# Patient Record
Sex: Female | Born: 1950 | Race: White | Hispanic: No | Smoking: Current every day smoker
Health system: Southern US, Community
[De-identification: ages and names within clinical notes are randomized; demographics above are authoritative.]

## PROBLEM LIST (undated history)

## (undated) DIAGNOSIS — E785 Hyperlipidemia, unspecified: Secondary | ICD-10-CM

## (undated) DIAGNOSIS — Z923 Personal history of irradiation: Secondary | ICD-10-CM

## (undated) DIAGNOSIS — E079 Disorder of thyroid, unspecified: Secondary | ICD-10-CM

## (undated) DIAGNOSIS — T7840XA Allergy, unspecified, initial encounter: Secondary | ICD-10-CM

## (undated) DIAGNOSIS — T300 Burn of unspecified body region, unspecified degree: Secondary | ICD-10-CM

## (undated) DIAGNOSIS — H269 Unspecified cataract: Secondary | ICD-10-CM

## (undated) DIAGNOSIS — Z5189 Encounter for other specified aftercare: Secondary | ICD-10-CM

## (undated) DIAGNOSIS — F329 Major depressive disorder, single episode, unspecified: Secondary | ICD-10-CM

## (undated) DIAGNOSIS — F32A Depression, unspecified: Secondary | ICD-10-CM

## (undated) DIAGNOSIS — F419 Anxiety disorder, unspecified: Secondary | ICD-10-CM

## (undated) HISTORY — DX: Disorder of thyroid, unspecified: E07.9

## (undated) HISTORY — DX: Allergy, unspecified, initial encounter: T78.40XA

## (undated) HISTORY — DX: Personal history of irradiation: Z92.3

## (undated) HISTORY — DX: Hyperlipidemia, unspecified: E78.5

## (undated) HISTORY — DX: Anxiety disorder, unspecified: F41.9

## (undated) HISTORY — PX: TONSILLECTOMY: SHX5217

## (undated) HISTORY — PX: WISDOM TOOTH EXTRACTION: SHX21

## (undated) HISTORY — DX: Burn of unspecified body region, unspecified degree: T30.0

## (undated) HISTORY — DX: Depression, unspecified: F32.A

## (undated) HISTORY — DX: Unspecified cataract: H26.9

## (undated) HISTORY — DX: Encounter for other specified aftercare: Z51.89

## (undated) HISTORY — PX: DILATION AND CURETTAGE OF UTERUS: SHX78

## (undated) HISTORY — PX: SKIN GRAFT: SHX250

## (undated) HISTORY — PX: ORIF CLAVICLE FRACTURE: SUR924

## (undated) HISTORY — DX: Major depressive disorder, single episode, unspecified: F32.9

---

## 2012-03-16 ENCOUNTER — Ambulatory Visit: Payer: Self-pay | Admitting: Family Medicine

## 2012-03-16 ENCOUNTER — Telehealth: Payer: Self-pay | Admitting: Family Medicine

## 2012-03-16 VITALS — BP 123/77 | HR 78 | Temp 98.1°F | Resp 16 | Ht 65.5 in | Wt 153.0 lb

## 2012-03-16 DIAGNOSIS — F329 Major depressive disorder, single episode, unspecified: Secondary | ICD-10-CM

## 2012-03-16 DIAGNOSIS — F32A Depression, unspecified: Secondary | ICD-10-CM

## 2012-03-16 DIAGNOSIS — G4700 Insomnia, unspecified: Secondary | ICD-10-CM

## 2012-03-16 DIAGNOSIS — J309 Allergic rhinitis, unspecified: Secondary | ICD-10-CM

## 2012-03-16 MED ORDER — FLUTICASONE PROPIONATE 50 MCG/ACT NA SUSP: 2.0000 | Freq: Every day | NASAL | Status: DC

## 2012-03-16 MED ORDER — ESCITALOPRAM OXALATE 10 MG PO TABS: 10.0000 mg | ORAL_TABLET | Freq: Every day | ORAL | Status: DC

## 2012-03-16 MED ORDER — ZOLPIDEM TARTRATE 10 MG PO TABS: ORAL_TABLET | ORAL | Status: DC
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
## 2012-03-16 NOTE — Telephone Encounter (Signed)
Called Ambien 10mg  ito [employer] at 2:20pm an left message on their machine

## 2012-03-26 ENCOUNTER — Telehealth: Payer: Self-pay

## 2012-03-26 DIAGNOSIS — F32A Depression, unspecified: Secondary | ICD-10-CM

## 2012-03-26 DIAGNOSIS — F329 Major depressive disorder, single episode, unspecified: Secondary | ICD-10-CM

## 2012-03-26 DIAGNOSIS — G4700 Insomnia, unspecified: Secondary | ICD-10-CM

## 2012-03-26 NOTE — Telephone Encounter (Signed)
Dr.Copland, Pt calling to inform you  that she is doing great. Also pt would like to have a 2 month supply of ambiene and a 3 month supply of lexapro called into her pharmacy. Pharmacy: [facility] [phone] ( pt would like to use this pharmacy because the copy is $5) Best# [phone]

## 2012-03-27 MED ORDER — ZOLPIDEM TARTRATE 10 MG PO TABS: ORAL_TABLET | ORAL | Status: DC

## 2012-03-27 MED ORDER — ESCITALOPRAM OXALATE 10 MG PO TABS: 20.0000 mg | ORAL_TABLET | Freq: Every day | ORAL | Status: DC

## 2012-03-27 NOTE — Telephone Encounter (Signed)
This is ok- she may have  lexapro 10mg  2po daily, #180 ambien 10mg  1 po qhs as needed for sleep #45 Called in to her pharmacy.  Thanks!

## 2012-03-27 NOTE — Telephone Encounter (Signed)
Called in both Rxs to [facility] and entered in EPIC as phone ins for record. LMOM for pt that her request was completed.

## 2012-03-27 NOTE — Telephone Encounter (Signed)
Jill Adams or Britta Mccreedy- whoever sees this first: This is fine- she may have  lexapro 10 mg 2 PO daily, #180 ambien 10mg  1PO qhs prn sleep, #45  Please call to her pharmacy

## 2012-04-01 ENCOUNTER — Telehealth: Payer: Self-pay

## 2012-04-01 NOTE — Telephone Encounter (Signed)
Note placed at front desk for her. I have left message for her to advise it is up front.

## 2012-04-01 NOTE — Telephone Encounter (Signed)
PT STATES SHE NEED AN OOW NOTE FOR THE 03/16/12 VISIT. DIDN'T REALIZE IT, BUT HER JOB IS REQUIRING IT. PLEASE CALL [phone] WHEN READY

## 2012-06-09 ENCOUNTER — Telehealth: Payer: Self-pay

## 2012-06-09 DIAGNOSIS — G4700 Insomnia, unspecified: Secondary | ICD-10-CM

## 2012-06-09 MED ORDER — ZOLPIDEM TARTRATE 10 MG PO TABS: ORAL_TABLET | ORAL | Status: DC

## 2012-06-09 NOTE — Telephone Encounter (Signed)
[facility]armacy [facility] is trying to get auth for rx refill on pt ambien, pharmacy states that cant escribe to [place] because we may not be linked up to do so. Please call in auth to pharmacy [phone] Marcelino Duster if know answer please leave a vm.

## 2012-06-09 NOTE — Telephone Encounter (Signed)
Please advise on renewal of Ambien. Fax request came in. [facility] states they can not send electronic.

## 2012-06-09 NOTE — Telephone Encounter (Signed)
Please call in zolpidem #30, take one tab [facility] for sleep prn

## 2012-06-10 ENCOUNTER — Telehealth: Payer: Self-pay | Admitting: Family Medicine

## 2012-06-10 NOTE — Telephone Encounter (Signed)
Spoke with pharmacist call in RX

## 2012-07-14 ENCOUNTER — Other Ambulatory Visit: Payer: Self-pay | Admitting: Physician Assistant

## 2012-07-14 MED ORDER — ESCITALOPRAM OXALATE 20 MG PO TABS: 20.0000 mg | ORAL_TABLET | Freq: Every day | ORAL | Status: DC

## 2012-07-22 ENCOUNTER — Telehealth: Payer: Self-pay

## 2012-07-22 DIAGNOSIS — G4700 Insomnia, unspecified: Secondary | ICD-10-CM

## 2012-07-22 MED ORDER — ZOLPIDEM TARTRATE 10 MG PO TABS: ORAL_TABLET | ORAL | Status: DC

## 2012-07-22 NOTE — Telephone Encounter (Signed)
PT HAS REQUESTED AMBIEN - DID NOT GET A RESPONSE -  [facility]  PHONE [phone]  FAX  [phone]  PT CAN BE REACHED ON CELL PHONE [phone] (H)

## 2012-07-22 NOTE — Telephone Encounter (Signed)
Called pharmacy and did refill.  Also asked pt to see me in the next couple of months as we last visited in July

## 2012-10-31 ENCOUNTER — Ambulatory Visit: Payer: Self-pay | Admitting: Emergency Medicine

## 2012-10-31 VITALS — BP 148/80 | HR 66 | Temp 97.6°F | Resp 16 | Ht 65.5 in | Wt 163.0 lb

## 2012-10-31 DIAGNOSIS — G4700 Insomnia, unspecified: Secondary | ICD-10-CM

## 2012-10-31 DIAGNOSIS — F329 Major depressive disorder, single episode, unspecified: Secondary | ICD-10-CM

## 2012-10-31 DIAGNOSIS — F411 Generalized anxiety disorder: Secondary | ICD-10-CM

## 2012-10-31 DIAGNOSIS — F32A Depression, unspecified: Secondary | ICD-10-CM

## 2012-10-31 MED ORDER — ESCITALOPRAM OXALATE 20 MG PO TABS: 20.0000 mg | ORAL_TABLET | Freq: Every day | ORAL | Status: DC

## 2012-10-31 MED ORDER — ZOLPIDEM TARTRATE 10 MG PO TABS: ORAL_TABLET | ORAL | Status: DC

## 2012-10-31 NOTE — Patient Instructions (Addendum)

## 2012-10-31 NOTE — Progress Notes (Signed)
[facility] [address] [place] [address] [phone]  Date:  10/31/2012   Name:  Jill Adams   DOB:  1951/02/01   MRN:  [id number]  PCP:  No primary provider on file.    Chief Complaint: Medication Refill   History of Present Illness:  Jill Adams is a 62 y.o. very pleasant female patient who presents with the following:  Has issues with stress and anxiety and treated by Dr Patsy Lager with Remus Loffler and lexapro successfully with resolution of her symptoms.  Now has run out of her medication and needs a refill.  She is tolerating the meds well without any adverse reaction, including somnambulism and other nocturnal misadventures related to [place].  There is no problem list on file for this patient.   Past Medical History  Diagnosis Date  . Depression   . Cataract   . Anxiety     History reviewed. No pertinent past surgical history.  History  Substance Use Topics  . Smoking status: Current Every Day Smoker  . Smokeless tobacco: Not on file  . Alcohol Use: Yes    History reviewed. No pertinent family history.  No Known Allergies  Medication list has been reviewed and updated.  Current Outpatient Prescriptions on File Prior to Visit  Medication Sig Dispense Refill  . escitalopram (LEXAPRO) 20 MG tablet Take 1 tablet (20 mg total) by mouth daily.  30 tablet  2  . zolpidem (AMBIEN) 10 MG tablet Take one half or one tablet at bedtime as needed.  30 tablet  1  . fluticasone (FLONASE) 50 MCG/ACT nasal spray Place 2 sprays into the nose daily.  16 g  6   No current facility-administered medications on file prior to visit.    Review of Systems:  As per HPI, otherwise negative.    Physical Examination: Filed Vitals:   10/31/12 1127  BP: 148/80  Pulse: 66  Temp: 97.6 F (36.4 C)  Resp: 16   Filed Vitals:   10/31/12 1127  Height: 5' 5.5" (1.664 m)  Weight: 163 lb (73.936 kg)   Body mass index is 26.7 kg/(m^2). Ideal Body  Weight: Weight in (lb) to have BMI = 25: 152.2  GEN: WDWN, NAD, Non-toxic, A & O x 3 HEENT: Atraumatic, Normocephalic. Neck supple. No masses, No LAD. Ears and Nose: No external deformity. CV: RRR, No M/G/R. No JVD. No thrill. No extra heart sounds. PULM: CTA B, no wheezes, crackles, rhonchi. No retractions. No resp. distress. No accessory muscle use. ABD: S, NT, ND, +BS. No rebound. No HSM. EXTR: No c/c/e NEURO Normal gait.  PSYCH: Normally interactive. Conversant. Not depressed or anxious appearing.  Calm demeanor.    Assessment and Plan: Anxiety Refill meds Follow up with Dr Gwynne Edinger, Tessa Lerner, MD

## 2013-01-18 ENCOUNTER — Ambulatory Visit: Payer: Self-pay | Admitting: Physician Assistant

## 2013-01-18 VITALS — BP 130/78 | HR 79 | Temp 98.0°F | Resp 16 | Ht 65.5 in | Wt 162.0 lb

## 2013-01-18 DIAGNOSIS — Z111 Encounter for screening for respiratory tuberculosis: Secondary | ICD-10-CM

## 2013-01-18 NOTE — Progress Notes (Signed)
  Subjective:    Patient ID: Jill Adams, female    DOB: 1951-05-13, 62 y.o.   MRN: [id number]  HPI   Jill Adams is a very pleasant 62 yr old female here for TB skin test.  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  Has had PPD in the past, thinks last about 1 yr ago. Never positive.  Asymptomatic.  See questionnaire below.    Review of Systems  All other systems reviewed and are negative.       Objective:   Physical Exam  Vitals reviewed. Constitutional: She is oriented to person, place, and time. She appears well-developed and well-nourished. No distress.  HENT:  Head: Normocephalic and atraumatic.  Eyes: Conjunctivae are normal. No scleral icterus.  Cardiovascular: Normal rate, regular rhythm and normal heart sounds.   Pulmonary/Chest: Effort normal and breath sounds normal. She has no wheezes. She has no rales.  Neurological: She is alert and oriented to person, place, and time.  Skin: Skin is warm and dry.  Psychiatric: She has a normal mood and affect. Her behavior is normal.          Assessment & Plan:  Screening for tuberculosis   Jill Adams is a 62 yr old female here for TB skin test.  PPD placed.  Pt will return in 48-72 hours for reading.

## 2013-01-18 NOTE — Progress Notes (Signed)
  Tuberculosis Risk Questionnaire  1. Were you born outside the [place] in one of the following parts of the world: [place], [place], [place], [place] or [place]?  No  2. Have you traveled outside the [place] and lived for more than one month in one of the following parts of the world: [place], [place], [place], [place] or [place]?  No  3. Do you have a compromised immune system such as from any of the following conditions:HIV/AIDS, organ or bone marrow transplantation, diabetes, immunosuppressive medicines (e.g. Prednisone, Remicaide), leukemia, lymphoma, cancer of the head or neck, gastrectomy or jejunal bypass, end-stage renal disease (on dialysis), or silicosis?  No   4. Have you ever or do you plan on working in: a residential care center, a health care facility, a jail or prison or homeless shelter?  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 Have you ever: injected illegal drugs, used crack cocaine, lived in a homeless shelter  or been in jail or prison?   No  6. Have you ever been exposed to anyone with infectious tuberculosis?  No   Tuberculosis Symptom Questionnaire  Do you currently have any of the following symptoms?  1. Unexplained cough lasting more than 3 weeks? No  Unexplained fever lasting more than 3 weeks. No   3. Night Sweats (sweating that leaves the bedclothes and sheets wet)   No  4. Shortness of Breath No  5. Chest Pain No  6. Unintentional weight loss  No  7. Unexplained fatigue (very tired for no reason) No

## 2013-01-18 NOTE — Patient Instructions (Addendum)
Return in 48-72 hours to have your TB skin test read   Tuberculin Skin Test The PPD skin test is a method used to help with the diagnosis of a disease called tuberculosis (TB). HOW THE TEST IS DONE  The test site (usually the forearm) is cleansed. The PPD extract is then injected under the top layer of skin, causing a blister to form on the skin. The reaction will take 48 - 72 hours to develop. You must return to your health care provider within that time to have the area checked. This will determine whether you have had a significant reaction to the PPD test. A reaction is measured in millimeters of hard swelling (induration) at the site. PREPARATION FOR TEST  There is no special preparation for this test. People with a skin rash or other skin irritations on their arms may need to have the test performed at a different spot on the body. Tell your health care provider if you have ever had a positive PPD skin test. If so, you should not have a repeat PPD test. Tell your doctor if you have a medical condition or if you take certain drugs, such as steroids, that can affect your immune system. These situations may lead to inaccurate test results. NORMAL FINDINGS A negative reaction (no induration) or a level of hard swelling that falls below a certain cutoff may mean that a person has not been infected with the bacteria that cause TB. There are different cutoffs for children, people with HIV, and other risk groups. Unfortunately, this is not a perfect test, and up to 20% of people infected with tuberculosis may not have a reaction on the PPD skin test. In addition, certain conditions that affect the immune system (cancer, recent chemotherapy, late-stage AIDS) may cause a false-negative test result.  The reaction will take 48 - 72 hours to develop. You must return to your health care provider within that time to have the area checked. Follow your caregiver's instructions as to where and when to report for  this to be done. Ranges for normal findings may vary among different laboratories and hospitals. You should always check with your doctor after having lab work or other tests done to discuss the meaning of your test results and whether your values are considered within normal limits. WHAT ABNORMAL RESULTS MEAN  The results of the test depend on the size of the skin reaction and on the person being tested.  A small reaction (5 mm of hard swelling at the site) is considered to be positive in people who have HIV, who are taking steroid therapy, or who have been in close contact with a person who has active tuberculosis. Larger reactions (greater than or equal to 10 mm) are considered positive in people with diabetes or kidney failure, and in health care workers, among others. In people with no known risks for tuberculosis, a positive reaction requires 15 mm or more of hard swelling at the site. RISKS AND COMPLICATIONS There is a very small risk of severe redness and swelling of the arm in people who have had a previous positive PPD test and who have the test again. There also have been a few rare cases of this reaction in people who have not been tested before. CONSIDERATIONS  A positive skin test does not necessarily mean that a person has active tuberculosis. More tests will be done to check whether active disease is present. Many people who were born outside the [place] may  have had a vaccine called "BCG," which can lead to a false-positive test result. MEANING OF TEST  Your caregiver will go over the test results with you and discuss the importance and meaning of your results, as well as treatment options and the need for additional tests if necessary. OBTAINING THE TEST RESULTS It is your responsibility to obtain your test results. Ask the lab or department performing the test when and how you will get your results. Document Released: 05/15/2005 Document Revised: 10/28/2011 Document Reviewed:  07/17/2008 [facility] Patient Information 2014 [place], [place].

## 2013-01-20 ENCOUNTER — Ambulatory Visit (INDEPENDENT_AMBULATORY_CARE_PROVIDER_SITE_OTHER): Payer: Self-pay | Admitting: Radiology

## 2013-01-20 DIAGNOSIS — Z111 Encounter for screening for respiratory tuberculosis: Secondary | ICD-10-CM

## 2013-01-20 LAB — TB SKIN TEST
Induration: NEGATIVE mm
TB Skin Test: NEGATIVE

## 2013-01-20 NOTE — Addendum Note (Signed)
Addended by: Marinus Maw on: 01/20/2013 03:22 PM   Modules accepted: Level of Service

## 2013-01-20 NOTE — Progress Notes (Signed)
  Subjective:    Patient ID: Jill Adams, female    DOB: 12/04/1950, 62 y.o.   MRN: [id number]  HPI Patient here for tb read only results were negative with 0 mm. Induration.   Review of Systems     Objective:   Physical Exam        Assessment & Plan:

## 2013-10-20 ENCOUNTER — Other Ambulatory Visit: Payer: Self-pay | Admitting: Emergency Medicine

## 2013-12-27 ENCOUNTER — Encounter: Payer: Self-pay | Admitting: Family Medicine

## 2013-12-27 ENCOUNTER — Ambulatory Visit: Payer: Self-pay | Admitting: Family Medicine

## 2013-12-27 VITALS — BP 138/76 | HR 76 | Resp 16 | Ht 65.5 in | Wt 164.0 lb

## 2013-12-27 DIAGNOSIS — F329 Major depressive disorder, single episode, unspecified: Secondary | ICD-10-CM | POA: Insufficient documentation

## 2013-12-27 DIAGNOSIS — F3289 Other specified depressive episodes: Secondary | ICD-10-CM

## 2013-12-27 DIAGNOSIS — F32A Depression, unspecified: Secondary | ICD-10-CM

## 2013-12-27 MED ORDER — ESCITALOPRAM OXALATE 20 MG PO TABS: 20.0000 mg | ORAL_TABLET | Freq: Every day | ORAL | Status: DC

## 2013-12-27 NOTE — Progress Notes (Signed)
[facility] [address] [place] [address] [phone]  Date:  12/27/2013   Name:  Jill Adams   DOB:  [birth year]   MRN:  [id number]  PCP:  No primary provider on file.    Chief Complaint: Medication Refill   History of Present Illness:  Jill Adams is a 63 y.o. very pleasant female patient who presents with the following:  Here today to discuss medications.  She is taking lexapro still and feels that she is doing well with this.  She had been on [place] as well, but does not need this any more.  "when I take the lexapro I sleep well."  Overall she is pleased with her medication and would like to continue to take it.   Her anxiety is better- she has changed jobs.  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 Her last job was really stressful and unpleasant for her. She is much improved with her new work. She feels that her mood is about an 8 of 10.  She sleeps well, no anhedonia, appetite is good.    She would like to come in for a CPE/ labs in the fall.   Right now she is trying to work out her insurance and would prefer to defer these services as they would be self- pay There are no active problems to display for this patient.   Past Medical History  Diagnosis Date  . Depression   . Cataract   . Anxiety     No past surgical history on file.  History  Substance Use Topics  . Smoking status: Current Every Day Smoker  . Smokeless tobacco: Not on file  . Alcohol Use: Yes    No family history on file.  No Known Allergies  Medication list has been reviewed and updated.  Current Outpatient Prescriptions on File Prior to Visit  Medication Sig Dispense Refill  . escitalopram (LEXAPRO) 20 MG tablet Take 1 tablet (20 mg total) by mouth daily.  30 tablet  2  . fluticasone (FLONASE) 50 MCG/ACT nasal spray Place 2 sprays into the nose daily.  16 g  6  . zolpidem (AMBIEN) 10  MG tablet Take one half or one tablet at bedtime as needed.  30 tablet  1   No current facility-administered medications on file prior to visit.    Review of Systems:  As per HPI- otherwise negative.   Physical Examination: Filed Vitals:   12/27/13 1016  BP: 138/76  Pulse: 76  Resp: 16   Filed Vitals:   12/27/13 1016  Height: 5' 5.5" (1.664 m)  Weight: 164 lb (74.39 kg)   Body mass index is 26.87 kg/(m^2). Ideal Body Weight: Weight in (lb) to have BMI = 25: 152.2  GEN: WDWN, NAD, Non-toxic, A & O x 3, looks well, in good spirits HEENT: Atraumatic, Normocephalic. Neck supple. No masses, No LAD. Ears and Nose: No external deformity. CV: RRR, No M/G/R. No JVD. No thrill. No extra heart sounds. PULM: CTA B, no wheezes, crackles, rhonchi. No retractions. No resp. distress. No accessory muscle use. EXTR: No c/c/e NEURO Normal gait.  PSYCH: Normally interactive. Conversant. Not depressed or anxious appearing.  Calm demeanor.    Assessment and Plan: Depression - Plan: escitalopram (LEXAPRO) 20 MG tablet  Continue lexapro which is working well.  Plan a CPE in the fall  Signed Lamar Blinks, MD

## 2014-12-09 ENCOUNTER — Ambulatory Visit: Payer: PRIVATE HEALTH INSURANCE | Admitting: Family Medicine

## 2014-12-09 VITALS — BP 122/72 | HR 93 | Temp 98.0°F | Resp 17 | Ht 65.5 in | Wt 167.0 lb

## 2014-12-09 DIAGNOSIS — R0981 Nasal congestion: Secondary | ICD-10-CM

## 2014-12-09 DIAGNOSIS — R062 Wheezing: Secondary | ICD-10-CM | POA: Diagnosis not present

## 2014-12-09 MED ORDER — PREDNISONE 20 MG PO TABS: ORAL_TABLET | ORAL | Status: DC

## 2014-12-09 MED ORDER — ALBUTEROL SULFATE HFA 108 (90 BASE) MCG/ACT IN AERS: 2.0000 | INHALATION_SPRAY | Freq: Four times a day (QID) | RESPIRATORY_TRACT | Status: DC | PRN

## 2014-12-09 NOTE — Patient Instructions (Signed)
You have some wheezing and congestion. Use the albuterol as needed, and the prednisone as directed Let me know if you do not feel better in the next couple of days-- Sooner if worse.

## 2014-12-09 NOTE — Progress Notes (Signed)
[facility] [address] [place] [address] [phone]  Date:  12/09/2014   Name:  Jill Adams   DOB:  October 20, 1950   MRN:  [id number]  PCP:  No primary care provider on file.    Chief Complaint: Nasal Congestion; Cough; and URI   History of Present Illness:  Jill Adams is a 64 y.o. very pleasant female patient who presents with the following:  Generally healthy female here today with illness.  She has noted a lot of nasal discharge over the last 4 days.  She also notes ear congestion and some congestion in her chest.  She has a dry occasional cough.   She has not noted wheezing.  Difficulty hearing due to her ear sx  No fever- she has checked her temp.  She has noted some mild chills but thought this was due to sudafed No vomiting.   NKDA She is an emergency mental health clininicinas  She has not had labs in some time- she now has her insurnace set up. She is fasting except for coffee cream and sugar today but would prefer to do a full PE with labs another day  Patient Active Problem List   Diagnosis Date Noted  . Major depressive disorder, single episode, unspecified 12/27/2013    Past Medical History  Diagnosis Date  . Depression   . Cataract   . Anxiety     No past surgical history on file.  History  Substance Use Topics  . Smoking status: Current Some Day Smoker -- 0.10 packs/day for 35 years    Types: Cigarettes  . Smokeless tobacco: Not on file  . Alcohol Use: 0.0 oz/week    0 Standard drinks or equivalent per week    No family history on file.  No Known Allergies  Medication list has been reviewed and updated.  Current Outpatient Prescriptions on File Prior to Visit  Medication Sig Dispense Refill  . escitalopram (LEXAPRO) 20 MG tablet Take 1 tablet (20 mg total) by mouth daily. 90 tablet 3   No current facility-administered medications on file prior to visit.    Review of Systems:  As per HPI- otherwise  negative.   Physical Examination: Filed Vitals:   12/09/14 0924  BP: 122/72  Pulse: 93  Temp: 98 F (36.7 C)  Resp: 17   Filed Vitals:   12/09/14 0924  Height: 5' 5.5" (1.664 m)  Weight: 167 lb (75.751 kg)   Body mass index is 27.36 kg/(m^2). Ideal Body Weight: Weight in (lb) to have BMI = 25: 152.2  GEN: WDWN, NAD, Non-toxic, A & O x 3, looks well HEENT: Atraumatic, Normocephalic. Neck supple. No masses, No LAD.  Bilateral TM wnl, oropharynx normal.  PEERL,EOMI.   Removed some wax from her bilateral ears with curette and she felt better Ears and Nose: No external deformity. CV: RRR, No M/G/R. No JVD. No thrill. No extra heart sounds. PULM: CTA B, no crackles, rhonchi. No retractions. No resp. distress. No accessory muscle use.  Mild bilateral expiratory wheezes  EXTR: No c/c/e NEURO Normal gait.  PSYCH: Normally interactive. Conversant. Not depressed or anxious appearing.  Calm demeanor.    Assessment and Plan: Wheezing - Plan: predniSONE (DELTASONE) 20 MG tablet  Sinus congestion - Plan: albuterol (PROVENTIL HFA;VENTOLIN HFA) 108 (90 BASE) MCG/ACT inhaler  Treat for congestion and wheezing as above.  She will let me know if not not better soon- Sooner if worse.   Plan to  come in for a CPE soon  Signed Lamar Blinks, MD

## 2014-12-10 ENCOUNTER — Telehealth: Payer: Self-pay

## 2014-12-10 NOTE — Telephone Encounter (Signed)
The patient wants to know if she can take Benadryl or Sudafed along with the medications that were prescribed yesterday (Prednisone and Albuterol).  Please advise.  Thank you.  CB#: [phone]

## 2014-12-10 NOTE — Telephone Encounter (Signed)
Is this ok?

## 2014-12-11 NOTE — Telephone Encounter (Signed)
Let patient know that benadryl is okay however Sudafed can make her jittery if she is taking steroid and albuterol together. Advised that if this is the case, hold the Sudafed and finish steroid course before restarting Sudafed.

## 2015-07-17 ENCOUNTER — Ambulatory Visit (INDEPENDENT_AMBULATORY_CARE_PROVIDER_SITE_OTHER): Payer: PRIVATE HEALTH INSURANCE | Admitting: Emergency Medicine

## 2015-07-17 VITALS — BP 110/78 | HR 83 | Temp 98.3°F | Resp 17 | Ht 65.0 in | Wt 162.8 lb

## 2015-07-17 DIAGNOSIS — F329 Major depressive disorder, single episode, unspecified: Secondary | ICD-10-CM

## 2015-07-17 DIAGNOSIS — M7022 Olecranon bursitis, left elbow: Secondary | ICD-10-CM | POA: Diagnosis not present

## 2015-07-17 DIAGNOSIS — F32A Depression, unspecified: Secondary | ICD-10-CM

## 2015-07-17 MED ORDER — NAPROXEN SODIUM 550 MG PO TABS
550.0000 mg | ORAL_TABLET | Freq: Two times a day (BID) | ORAL | Status: AC
Start: 2015-07-17 — End: 2016-07-16

## 2015-07-17 MED ORDER — ESCITALOPRAM OXALATE 20 MG PO TABS: 20.0000 mg | ORAL_TABLET | Freq: Every day | ORAL | Status: DC

## 2015-07-17 NOTE — Progress Notes (Signed)
Subjective:  Patient ID: Irine Seal, female    DOB: 22-Oct-1950  Age: 64 y.o. MRN: [id number]  CC: Other   HPI ZEYLA PLAGEMAN presents  with a several week duration swelling in her right elbow. She denies any history of injury or overuse. Denies any fever chills redness or says that the lesion is uncomfortable when she strikes with her elbow and is unsightly. She has no history of improvement with over-the-counter medication  History Kendrianna has a past medical history of Depression; Cataract; and Anxiety.   She has no past surgical history on file.   Her  family history is not on file.  She   reports that she has been smoking Cigarettes.  She has a 3.5 pack-year smoking history. She does not have any smokeless tobacco history on file. She reports that she drinks alcohol. She reports that she does not use illicit drugs.  Outpatient Prescriptions Prior to Visit  Medication Sig Dispense Refill  . escitalopram (LEXAPRO) 20 MG tablet Take 1 tablet (20 mg total) by mouth daily. 90 tablet 3  . albuterol (PROVENTIL HFA;VENTOLIN HFA) 108 (90 BASE) MCG/ACT inhaler Inhale 2 puffs into the lungs every 6 (six) hours as needed for wheezing or shortness of breath. (Patient not taking: Reported on 07/17/2015) 1 Inhaler 0  . predniSONE (DELTASONE) 20 MG tablet Take 2 pills a day for 3 days (Patient not taking: Reported on 07/17/2015) 6 tablet 0   No facility-administered medications prior to visit.    Social History   Social History  . Marital Status: Single    Spouse Name: N/A  . Number of Children: N/A  . Years of Education: N/A   Social History Main Topics  . Smoking status: Current Some Day Smoker -- 0.10 packs/day for 35 years    Types: Cigarettes  . Smokeless tobacco: None  . Alcohol Use: 0.0 oz/week    0 Standard drinks or equivalent per week  . Drug Use: No  . Sexual Activity: Not Asked   Other Topics Concern  . None   Social History Narrative     Review of  Systems  Constitutional: Negative for fever, chills and appetite change.  HENT: Negative for congestion, ear pain, postnasal drip, sinus pressure and sore throat.   Eyes: Negative for pain and redness.  Respiratory: Negative for cough, shortness of breath and wheezing.   Cardiovascular: Negative for leg swelling.  Gastrointestinal: Negative for nausea, vomiting, abdominal pain, diarrhea, constipation and blood in stool.  Endocrine: Negative for polyuria.  Genitourinary: Negative for dysuria, urgency, frequency and flank pain.  Musculoskeletal: Negative for gait problem.  Skin: Negative for rash.  Neurological: Negative for weakness and headaches.  Psychiatric/Behavioral: Negative for confusion and decreased concentration. The patient is not nervous/anxious.     Objective:  BP 110/78 mmHg  Pulse 83  Temp(Src) 98.3 F (36.8 C) (Oral)  Resp 17  Ht 5\' 5"  (1.651 m)  Wt 162 lb 12.8 oz (73.846 kg)  BMI 27.09 kg/m2  SpO2 93%  Physical Exam  Constitutional: She is oriented to person, place, and time. She appears well-developed and well-nourished.  HENT:  Head: Normocephalic and atraumatic.  Eyes: Conjunctivae are normal. Pupils are equal, round, and reactive to light.  Pulmonary/Chest: Effort normal.  Musculoskeletal: She exhibits no edema.  Neurological: She is alert and oriented to person, place, and time.  Skin: Skin is dry.  Psychiatric: She has a normal mood and affect. Her behavior is normal. Thought content normal.  She has a very substantial olecranon bursa effusion   Assessment & Plan:   Cayse was seen today for other.  Diagnoses and all orders for this visit:  Major depressive disorder, single episode, unspecified ([place])  Olecranon bursitis of left elbow  Depression -     escitalopram (LEXAPRO) 20 MG tablet; Take 1 tablet (20 mg total) by mouth daily.  Other orders -     naproxen sodium (ANAPROX DS) 550 MG tablet; Take 1 tablet (550 mg total) by mouth 2 (two)  times daily with a meal.   I am having Ms. Zietz start on naproxen sodium. I am also having her maintain her albuterol, predniSONE, and escitalopram.  Meds ordered this encounter  Medications  . escitalopram (LEXAPRO) 20 MG tablet    Sig: Take 1 tablet (20 mg total) by mouth daily.    Dispense:  90 tablet    Refill:  3  . naproxen sodium (ANAPROX DS) 550 MG tablet    Sig: Take 1 tablet (550 mg total) by mouth 2 (two) times daily with a meal.    Dispense:  40 tablet    Refill:  0   The bursa was aspirated following infiltration was with a small dose of lidocaine. The elbow was prepped and draped in suitable fashion. She was then aspirated 20 mL of serosanguineous fluid following which 40 mg of Depo-Medrol was injected. She tolerated the procedure well  Appropriate red flag conditions were discussed with the patient as well as actions that should be taken.  Patient expressed his understanding.  Follow-up: Return if symptoms worsen or fail to improve.  Roselee Culver, MD

## 2015-07-17 NOTE — Patient Instructions (Signed)
Elbow Bursitis  Elbow bursitis is inflammation of the fluid-filled sac (bursa) between the tip of your elbow bone (olecranon) and your skin. Elbow bursitis may also be called olecranon bursitis.  Normally, the olecranon bursa has only a small amount of fluid in it to cushion and protect your elbow bone. Elbow bursitis causes fluid to build up inside the bursa. Over time, this swelling and inflammation can cause pain when you bend or lean on your elbow.   CAUSES  Elbow bursitis may be caused by:    Elbow injury (acute trauma).   Leaning on hard surfaces for long periods of time.   Infection from an injury that breaks the skin near your elbow.   A bone growth (spur) that forms at the tip of your elbow.   A medical condition that causes inflammation in your body, such as gout or rheumatoid arthritis.   The cause may also be unknown.   SIGNS AND SYMPTOMS   The first sign of elbow bursitis is usually swelling over the tip of your elbow. This can grow to be the size of a golf ball. This may start suddenly or develop gradually. You may also have:   Pain when bending or leaning on your elbow.   Restricted movement of your elbow.   If your bursitis is caused by an infection, symptoms may also include:   Redness, warmth, and tenderness of the elbow.   Drainage of pus from the swollen area over your elbow, if the skin breaks open.  DIAGNOSIS   Your health care provider may be able to diagnose elbow bursitis based on your signs and symptoms, especially if you have recently been injured. Your health care provider will also do a physical exam. This may include:   X-rays to look for a bone spur or a bone fracture.   Draining fluid from the bursa to test it for infection.   Blood tests to rule out gout or rheumatoid arthritis.  TREATMENT   Treatment for elbow bursitis depends on the cause. Treatment may include:   Medicines. These may include:    Over-the-counter medicines to relieve pain and inflammation.     Antibiotic medicines to fight infection.    Injections of anti-inflammatory medicines (steroids).   Wrapping your elbow with a bandage.   Draining fluid from the bursa.   Wearing elbow pads.   If your bursitis does not get better with treatment, surgery may be needed to remove the bursa.   HOME CARE INSTRUCTIONS    Take medicines only as directed by your health care provider.   If you were prescribed an antibiotic medicine, finish all of it even if you start to feel better.   If your bursitis is caused by an injury, rest your elbow and wear your bandage as directed by your health care provider. You may alsoapply ice to the injured area as directed by your health care provider:   Put ice in a plastic bag.   Place a towel between your skin and the bag.   Leave the ice on for 20 minutes, 2-3 times per day.   Avoid any activities that cause elbow pain.   Use elbow pads or elbow wraps to cushion your elbow.  SEEK MEDICAL CARE IF:   You have a fever.    Your symptoms do not get better with treatment.   Your pain or swelling gets worse.   Your elbow pain or swelling goes away and then returns.   You have   drainage of pus from the swollen area over your elbow.     This information is not intended to replace advice given to you by your health care provider. Make sure you discuss any questions you have with your health care provider.     Document Released: 09/04/2006 Document Revised: 08/26/2014 Document Reviewed: 04/13/2014  Elsevier Interactive Patient Education 2016 Elsevier Inc.

## 2016-08-19 HISTORY — PX: COLONOSCOPY: SHX174

## 2017-05-12 ENCOUNTER — Ambulatory Visit (INDEPENDENT_AMBULATORY_CARE_PROVIDER_SITE_OTHER): Payer: PPO | Admitting: Family Medicine

## 2017-05-12 ENCOUNTER — Encounter: Payer: Self-pay | Admitting: Family Medicine

## 2017-05-12 VITALS — BP 126/80 | HR 94 | Temp 98.6°F | Resp 17 | Ht 65.5 in | Wt 169.4 lb

## 2017-05-12 DIAGNOSIS — Z780 Asymptomatic menopausal state: Secondary | ICD-10-CM

## 2017-05-12 DIAGNOSIS — Z0000 Encounter for general adult medical examination without abnormal findings: Secondary | ICD-10-CM

## 2017-05-12 DIAGNOSIS — Z124 Encounter for screening for malignant neoplasm of cervix: Secondary | ICD-10-CM | POA: Diagnosis not present

## 2017-05-12 DIAGNOSIS — Z23 Encounter for immunization: Secondary | ICD-10-CM

## 2017-05-12 DIAGNOSIS — Z1231 Encounter for screening mammogram for malignant neoplasm of breast: Secondary | ICD-10-CM

## 2017-05-12 DIAGNOSIS — Z1211 Encounter for screening for malignant neoplasm of colon: Secondary | ICD-10-CM

## 2017-05-12 DIAGNOSIS — Z13220 Encounter for screening for lipoid disorders: Secondary | ICD-10-CM | POA: Diagnosis not present

## 2017-05-12 DIAGNOSIS — E663 Overweight: Secondary | ICD-10-CM

## 2017-05-12 DIAGNOSIS — Z1239 Encounter for other screening for malignant neoplasm of breast: Secondary | ICD-10-CM

## 2017-05-12 DIAGNOSIS — Z87828 Personal history of other (healed) physical injury and trauma: Secondary | ICD-10-CM | POA: Diagnosis not present

## 2017-05-12 MED ORDER — ESCITALOPRAM OXALATE 20 MG PO TABS: 20.0000 mg | ORAL_TABLET | Freq: Every day | ORAL | 3 refills | Status: DC
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
## 2017-05-12 NOTE — Patient Instructions (Addendum)
We recommend that you schedule a mammogram for breast cancer screening. Typically, you do not need a referral to do this. Please contact a local imaging center to schedule your mammogram.  [facility] - [phone]  *ask for the Radiology Department [facility] [place] - [phone] or [phone]  [facility] - [phone] [place] [phone] [facility] - [phone]  *ask for the [facility] - [phone]  *ask for the Radiology Department [facility] - [phone]  *ask for the [place] - [phone]     IF you received an x-ray today, you will receive an invoice from [facility]. Please contact [facility] at [phone] with questions or concerns regarding your invoice.   IF you received labwork today, you will receive an invoice from [place]. Please contact [facility] at [phone] with questions or concerns regarding your invoice.   Our billing staff will not be able to assist you with questions regarding bills from these companies.  You will be contacted with the lab results as soon as they are available. The fastest way to get your results is to activate your My Chart account. Instructions are located on the last page of this paperwork. If you have not heard from [place] regarding the results in 2 weeks, please contact this office.     What You Need to Know About Cancer Prevention Although there is no guaranteed method for preventing all cancers, there are many steps that you can take to lower your risk of developing the disease. Making healthy food choices, maintaining a healthy lifestyle, getting regular screenings, and knowing your family's cancer history are all ways that can help to reduce your risk. What nutrition changes can be made? Maintaining a healthy, plant-based diet is one of the  easiest ways to lower your cancer risk.  Try to eat more than 2 cups of fruits and vegetables every day.  Eat whole-grain foods instead of refined or processed grains.  Cut down on the amount of red meat and processed meat that you eat. Also limit your intake of charred and smoked meat.  Eat portions that help you stay at a healthy weight.  Limit alcohol intake to no more than 1 drink a day for nonpregnant women and 2 drinks a day for men. One drink equals 12 oz of beer, 5 oz of wine, or 1 oz of hard liquor.  What lifestyle changes can be made?  Do not use any products that contain nicotine or tobacco, such as cigarettes and e-cigarettes. If you need help quitting, ask your health care provider.  Get regular exercise. Adults should aim for 150 minutes of moderate-intensity exercise (walking, biking, yoga) or 75 minutes of vigorous exercise (running, circuit training, swimming) every week. Exercise should be spread throughout the week, if possible.  Stay at a healthy weight. Talk with your health care provider about what your weight should be.  Reduce activities that involve a lack of physical activity (are sedentary) such as watching TV.  Stay safe in the sun by applying sunscreen and covering up with hats, clothing, and sunglasses.  Do not use tanning beds or sunlamps.  Avoid exposure to harmful substances such as asbestos, silica, solvents, or radon. Wear a protective mask if you must work near harmful substances. Have your home checked for radon, and hire a professional to lower the radon level if needed.  Get vaccines to  help prevent conditions that can eventually lead to cancer, such as hepatitis and HPV (human papillomavirus). Ask your health care provider about which vaccines you should get. Why are these changes important?  Tobacco use is the leading cause of cancer and death from cancer.  Cancer cases in the [place] are linked to higher body weight and obesity, lack  of physical activity, and an unhealthy diet.  Cutting your exposure to ultraviolet (UV) radiation and avoiding sunburns lowers your chance of developing skin cancer or melanoma.  HPV is associated with several types of cancer, such as penile, anal, cervical, vulvar, and throat cancer. The HPV vaccine can help to reduce the spread of this virus and help to lower the risk of developing cancer. What can happen if changes are not made? If you do not maintain a healthy diet and lifestyle, reduce sun exposure, or quit tobacco use, you may raise your risk of developing certain types of cancer.  Tobacco use has been linked to cancers of the lung, mouth, esophagus, throat, bladder, kidney, liver, stomach, colon and rectum, and cervix.  Obesity has been linked to an increased risk of developing cancers of the breast, colon and rectum, esophagus, kidney, pancreas, and gallbladder.  Exposure to UV radiation can cause skin damage that can lead to skin cancer and melanoma.  What can I do to lower my risk? Along with having a healthy diet and lifestyle, you should talk with your health care provider about recommendations for cancer screening.  Pap and HPV testing help to reduce a woman's risk for developing cervical cancer.  Mammograms help to detect signs of breast cancer and have been shown to reduce death from breast cancer in women over age 70.  Colorectal cancer screenings-including colonoscopy, sigmoidoscopy, and stool tests-can help to detect early signs of cancer.  Lung cancer screening with CT scanning has been shown to reduce cancer deaths in heavy smokers over age 59.  Skin exams are sometimes recommended for people who are at a high risk for developing skin cancer. Talk with your health care provider or dermatologist if you notice any new skin changes, moles, or changes to existing moles.  Also talk with your health care provider about any history of cancer in your family. Depending on your  family history of cancer, your health care provider may recommend genetic testing to determine whether you may be at higher risk for developing certain types of cancer. Results from these tests can help in making decisions about future medical care and steps for prevention. Where to find more information:  [place]: www.cancer.gov  Cancer Trends Progress Report: www.progressreport.cancer.gov  American Cancer Society: www.cancer.org Contact a health care provider if:  You would like to discuss healthy ways to improve your diet and lifestyle.  You would like to learn more about quitting smoking or tobacco use.  You would like to discuss your family history of cancer and recommendations for cancer screening. Summary  You can take steps to reduce your risk of developing cancer.  Maintaining a healthy, plant-based diet is one of the easiest ways to lower your cancer risk.  Lifestyle changes can help to reduce your cancer risk. These include staying away from tobacco, reducing sun exposure, and getting regular exercise.  Follow recommendations from your health care provider about screening tests for cancer of the cervix, breast, colon and rectum, lung, and skin.  Talk with your health care provider about any history of cancer in your family. This information is not  intended to replace advice given to you by your health care provider. Make sure you discuss any questions you have with your health care provider. Document Released: 05/17/2016 Document Revised: 05/17/2016 Document Reviewed: 05/17/2016 Elsevier Interactive Patient Education  [employer].

## 2017-05-12 NOTE — Progress Notes (Signed)
  HPI ROS Physical Exam   Changed to a welcome to medicare visit

## 2017-05-13 LAB — PAP IG AND HPV HIGH-RISK
HPV, high-risk: NEGATIVE
PAP SMEAR COMMENT: 0

## 2017-05-13 LAB — LIPID PANEL
CHOL/HDL RATIO: 4.1 ratio (ref 0.0–4.4)
Cholesterol, Total: 278 mg/dL — ABNORMAL HIGH (ref 100–199)
HDL: 68 mg/dL (ref >39)
LDL Calculated: 186 mg/dL — ABNORMAL HIGH (ref 0–99)
Triglycerides: 121 mg/dL (ref 0–149)
VLDL CHOLESTEROL CAL: 24 mg/dL (ref 5–40)

## 2017-05-13 LAB — COMPREHENSIVE METABOLIC PANEL
ALT: 13 IU/L (ref 0–32)
AST: 16 IU/L (ref 0–40)
Albumin/Globulin Ratio: 1.9 (ref 1.2–2.2)
Albumin: 4.7 g/dL (ref 3.6–4.8)
Alkaline Phosphatase: 81 IU/L (ref 39–117)
BUN/Creatinine Ratio: 17 (ref 12–28)
BUN: 11 mg/dL (ref 8–27)
Bilirubin Total: 0.3 mg/dL (ref 0.0–1.2)
CO2: 20 mmol/L (ref 20–29)
CREATININE: 0.66 mg/dL (ref 0.57–1.00)
Calcium: 10.1 mg/dL (ref 8.7–10.3)
Chloride: 99 mmol/L (ref 96–106)
GFR, EST AFRICAN AMERICAN: 106 mL/min/{1.73_m2} (ref >59)
GFR, EST NON AFRICAN AMERICAN: 92 mL/min/{1.73_m2} (ref >59)
GLUCOSE: 104 mg/dL — AB (ref 65–99)
Globulin, Total: 2.5 g/dL (ref 1.5–4.5)
Potassium: 4.6 mmol/L (ref 3.5–5.2)
Sodium: 136 mmol/L (ref 134–144)
TOTAL PROTEIN: 7.2 g/dL (ref 6.0–8.5)

## 2017-05-15 ENCOUNTER — Encounter: Payer: Self-pay | Admitting: Gastroenterology

## 2017-05-16 ENCOUNTER — Other Ambulatory Visit: Payer: Self-pay | Admitting: Family Medicine

## 2017-05-16 MED ORDER — PRAVASTATIN SODIUM 20 MG PO TABS: 20.0000 mg | ORAL_TABLET | Freq: Every day | ORAL | 3 refills | Status: DC

## 2017-05-21 ENCOUNTER — Ambulatory Visit
Admission: RE | Admit: 2017-05-21 | Discharge: 2017-05-21 | Disposition: A | Discharge status: Home / Self Care | Payer: PPO | Source: Ambulatory Visit | Attending: Family Medicine | Admitting: Family Medicine

## 2017-05-21 DIAGNOSIS — Z780 Asymptomatic menopausal state: Secondary | ICD-10-CM | POA: Diagnosis not present

## 2017-05-21 DIAGNOSIS — M810 Age-related osteoporosis without current pathological fracture: Secondary | ICD-10-CM | POA: Diagnosis not present

## 2017-05-21 DIAGNOSIS — Z1239 Encounter for other screening for malignant neoplasm of breast: Secondary | ICD-10-CM

## 2017-05-21 DIAGNOSIS — Z1231 Encounter for screening mammogram for malignant neoplasm of breast: Secondary | ICD-10-CM | POA: Diagnosis not present

## 2017-05-27 ENCOUNTER — Telehealth: Payer: Self-pay | Admitting: Family Medicine

## 2017-05-27 NOTE — Telephone Encounter (Signed)
Pt had a physical on September 24 but is now sick with a cold and would to have something called in for her   Best number [phone]

## 2017-05-29 NOTE — Telephone Encounter (Signed)
Spoke with pt and symptoms are improving.

## 2017-06-04 ENCOUNTER — Encounter: Payer: Self-pay | Admitting: Family Medicine

## 2017-06-12 ENCOUNTER — Encounter: Payer: Self-pay | Admitting: Gastroenterology

## 2017-06-12 ENCOUNTER — Ambulatory Visit (AMBULATORY_SURGERY_CENTER): Payer: Self-pay

## 2017-06-12 VITALS — Ht 65.75 in | Wt 164.0 lb

## 2017-06-12 DIAGNOSIS — Z1211 Encounter for screening for malignant neoplasm of colon: Secondary | ICD-10-CM

## 2017-06-12 MED ORDER — SUPREP BOWEL PREP KIT 17.5-3.13-1.6 GM/177ML PO SOLN: 1.0000 | Freq: Once | ORAL | 0 refills | Status: AC

## 2017-06-12 NOTE — Progress Notes (Signed)
No diet meds No allergies to eggs or soy No home oxygen No past problems with anesthesia  Registered emmi

## 2017-06-19 ENCOUNTER — Ambulatory Visit (AMBULATORY_SURGERY_CENTER): Payer: PPO | Admitting: Gastroenterology

## 2017-06-19 VITALS — BP 116/68 | HR 71 | Temp 97.1°F | Resp 14 | Ht 65.0 in | Wt 164.0 lb

## 2017-06-19 DIAGNOSIS — Z1211 Encounter for screening for malignant neoplasm of colon: Secondary | ICD-10-CM | POA: Diagnosis not present

## 2017-06-19 DIAGNOSIS — K621 Rectal polyp: Secondary | ICD-10-CM

## 2017-06-19 DIAGNOSIS — D120 Benign neoplasm of cecum: Secondary | ICD-10-CM

## 2017-06-19 DIAGNOSIS — D128 Benign neoplasm of rectum: Secondary | ICD-10-CM

## 2017-06-19 DIAGNOSIS — D124 Benign neoplasm of descending colon: Secondary | ICD-10-CM

## 2017-06-19 DIAGNOSIS — D127 Benign neoplasm of rectosigmoid junction: Secondary | ICD-10-CM | POA: Diagnosis not present

## 2017-06-19 DIAGNOSIS — R1310 Dysphagia, unspecified: Secondary | ICD-10-CM | POA: Diagnosis not present

## 2017-06-19 DIAGNOSIS — R569 Unspecified convulsions: Secondary | ICD-10-CM | POA: Diagnosis not present

## 2017-06-19 DIAGNOSIS — K209 Esophagitis, unspecified: Secondary | ICD-10-CM | POA: Diagnosis not present

## 2017-06-19 DIAGNOSIS — K635 Polyp of colon: Secondary | ICD-10-CM | POA: Diagnosis not present

## 2017-06-19 DIAGNOSIS — K219 Gastro-esophageal reflux disease without esophagitis: Secondary | ICD-10-CM | POA: Diagnosis not present

## 2017-06-19 MED ORDER — SODIUM CHLORIDE 0.9 % IV SOLN: 500.0000 mL | INTRAVENOUS | Status: DC

## 2017-06-19 NOTE — Progress Notes (Signed)
Pt's states no medical or surgical changes since previsit or office visit. 

## 2017-06-19 NOTE — Progress Notes (Signed)
Called to room to assist during endoscopic procedure.  Patient ID and intended procedure confirmed with present staff. Received instructions for my participation in the procedure from the performing physician.  

## 2017-06-19 NOTE — Progress Notes (Signed)
Report given to PACU, vss 

## 2017-06-19 NOTE — Op Note (Signed)
[facility] Patient Name: Jill Adams Procedure Date: 06/19/2017 1:57 PM MRN: [id number] Endoscopist: Remo Lipps P. Armbruster MD, MD Age: 66 Referring MD:  Date of Birth: March 08, 1951 Gender: Female Account #: [id number] Procedure:                Colonoscopy Indications:              Screening for colorectal malignant neoplasm, This                            is the patient's first colonoscopy Medicines:                Monitored Anesthesia Care Procedure:                Pre-Anesthesia Assessment:                           - Prior to the procedure, a History and Physical                            was performed, and patient medications and                            allergies were reviewed. The patient's tolerance of                            previous anesthesia was also reviewed. The risks                            and benefits of the procedure and the sedation                            options and risks were discussed with the patient.                            All questions were answered, and informed consent                            was obtained. Prior Anticoagulants: The patient has                            taken no previous anticoagulant or antiplatelet                            agents. ASA Grade Assessment: I - A normal, healthy                            patient. After reviewing the risks and benefits,                            the patient was deemed in satisfactory condition to                            undergo the procedure.  After obtaining informed consent, the colonoscope                            was passed under direct vision. Throughout the                            procedure, the patient's blood pressure, pulse, and                            oxygen saturations were monitored continuously. The                            Colonoscope was introduced through the anus and                            advanced to the the cecum,  identified by                            appendiceal orifice and ileocecal valve. The                            colonoscopy was performed without difficulty. The                            patient tolerated the procedure well. The quality                            of the bowel preparation was good. The ileocecal                            valve, appendiceal orifice, and rectum were                            photographed. Scope In: 2:12:10 PM Scope Out: 2:41:04 PM Scope Withdrawal Time: 0 hours 24 minutes 36 seconds  Total Procedure Duration: 0 hours 28 minutes 54 seconds  Findings:                 The perianal and digital rectal examinations were                            normal.                           Three sessile polyps were found in the cecum. The                            polyps were 3 to 5 mm in size. These polyps were                            removed with a cold snare. Resection and retrieval                            were complete.  A diminutive polyp was found in the cecum. The                            polyp was sessile. The polyp was removed with a                            cold biopsy forceps. Resection and retrieval were                            complete.                           A 5 mm polyp was found in the descending colon. The                            polyp was sessile. The polyp was removed with a                            cold snare. Resection and retrieval were complete.                           A 4 mm polyp was found in the recto-sigmoid colon.                            The polyp was sessile. The polyp was removed with a                            cold snare. Resection and retrieval were complete.                           Four sessile polyps were found in the rectum. The                            polyps were 3 to 6 mm in size. These polyps were                            removed with a cold snare. Resection and  retrieval                            were complete.                           Multiple medium-mouthed diverticula were found in                            the transverse colon, left colon and right colon.                           The exam was otherwise without abnormality. Complications:            No immediate complications. Estimated blood loss:  Minimal. Estimated Blood Loss:     Estimated blood loss was minimal. Impression:               - Three 3 to 5 mm polyps in the cecum, removed with                            a cold snare. Resected and retrieved.                           - One diminutive polyp in the cecum, removed with a                            cold biopsy forceps. Resected and retrieved.                           - One 5 mm polyp in the descending colon, removed                            with a cold snare. Resected and retrieved.                           - One 4 mm polyp at the recto-sigmoid colon,                            removed with a cold snare. Resected and retrieved.                           - Four 3 to 6 mm polyps in the rectum, removed with                            a cold snare. Resected and retrieved.                           - Diverticulosis in the transverse colon, in the                            left colon and in the right colon.                           - The examination was otherwise normal. Recommendation:           - Patient has a contact number available for                            emergencies. The signs and symptoms of potential                            delayed complications were discussed with the                            patient. Return to normal activities tomorrow.                            Written  discharge instructions were provided to the                            patient.                           - Resume previous diet.                           - Continue present medications.                            - Await pathology results.                           - Repeat colonoscopy is recommended for                            surveillance. The colonoscopy date will be                            determined after pathology results from today's                            exam become available for review.                           - No ibuprofen, naproxen, or other non-steroidal                            anti-inflammatory drugs for 2 weeks after polyp                            removal. Remo Lipps P. Armbruster MD, MD 06/19/2017 2:47:32 PM This report has been signed electronically.

## 2017-06-19 NOTE — Patient Instructions (Signed)
Impression/Recommendations:  Polyp handout given to patient. Diverticulosis handout given to patient.  Resume previous diet. Continue present medications.  Await pathology results.  Repeat colonoscopy recommended for surveillance.  Date to be determined after pathology results reviewed.  No ibuprofen, naproxen, or other NSAID drugs for 2 weeks.  Tylenol only until Nov. 15, 2018.  YOU HAD AN ENDOSCOPIC PROCEDURE TODAY AT [facility]:   Refer to the procedure report that was given to you for any specific questions about what was found during the examination.  If the procedure report does not answer your questions, please call your gastroenterologist to clarify.  If you requested that your care partner not be given the details of your procedure findings, then the procedure report has been included in a sealed envelope for you to review at your convenience later.  YOU SHOULD EXPECT: Some feelings of bloating in the abdomen. Passage of more gas than usual.  Walking can help get rid of the air that was put into your GI tract during the procedure and reduce the bloating. If you had a lower endoscopy (such as a colonoscopy or flexible sigmoidoscopy) you may notice spotting of blood in your stool or on the toilet paper. If you underwent a bowel prep for your procedure, you may not have a normal bowel movement for a few days.  Please Note:  You might notice some irritation and congestion in your nose or some drainage.  This is from the oxygen used during your procedure.  There is no need for concern and it should clear up in a day or so.  SYMPTOMS TO REPORT IMMEDIATELY:   Following lower endoscopy (colonoscopy or flexible sigmoidoscopy):  Excessive amounts of blood in the stool  Significant tenderness or worsening of abdominal pains  Swelling of the abdomen that is new, acute  Fever of 100F or higher For urgent or emergent issues, a gastroenterologist can be reached at any hour by  calling [phone].   DIET:  We do recommend a small meal at first, but then you may proceed to your regular diet.  Drink plenty of fluids but you should avoid alcoholic beverages for 24 hours.  ACTIVITY:  You should plan to take it easy for the rest of today and you should NOT DRIVE or use heavy machinery until tomorrow (because of the sedation medicines used during the test).    FOLLOW UP: Our staff will call the number listed on your records the next business day following your procedure to check on you and address any questions or concerns that you may have regarding the information given to you following your procedure. If we do not reach you, we will leave a message.  However, if you are feeling well and you are not experiencing any problems, there is no need to return our call.  We will assume that you have returned to your regular daily activities without incident.  If any biopsies were taken you will be contacted by phone or by letter within the next 1-3 weeks.  Please call us at [phone] if you have not heard about the biopsies in 3 weeks.    SIGNATURES/CONFIDENTIALITY: You and/or your care partner have signed paperwork which will be entered into your electronic medical record.  These signatures attest to the fact that that the information above on your After Visit Summary has been reviewed and is understood.  Full responsibility of the confidentiality of this discharge information lies with you and/or your care-partner.

## 2017-06-20 ENCOUNTER — Telehealth: Payer: Self-pay

## 2017-06-20 NOTE — Telephone Encounter (Signed)
  Follow up Call-  Call back number 06/19/2017  Post procedure Call Back phone  # [phone]  Permission to leave phone message Yes  Some recent data might be hidden     Patient questions:  Do you have a fever, pain , or abdominal swelling? No. Pain Score  0 *  Have you tolerated food without any problems? Yes.    Have you been able to return to your normal activities? Yes.    Do you have any questions about your discharge instructions: Diet   No. Medications  No. Follow up visit  No.  Do you have questions or concerns about your Care? No.  Actions: * If pain score is 4 or above: No action needed, pain <4.

## 2017-06-27 ENCOUNTER — Encounter: Payer: Self-pay | Admitting: Gastroenterology

## 2017-08-07 ENCOUNTER — Ambulatory Visit (INDEPENDENT_AMBULATORY_CARE_PROVIDER_SITE_OTHER): Payer: PPO | Admitting: Family Medicine

## 2017-08-07 ENCOUNTER — Encounter: Payer: Self-pay | Admitting: Family Medicine

## 2017-08-07 ENCOUNTER — Other Ambulatory Visit: Payer: Self-pay

## 2017-08-07 VITALS — BP 124/70 | HR 84 | Temp 98.0°F | Resp 16 | Ht 65.0 in | Wt 168.2 lb

## 2017-08-07 DIAGNOSIS — Z23 Encounter for immunization: Secondary | ICD-10-CM | POA: Diagnosis not present

## 2017-08-07 DIAGNOSIS — M810 Age-related osteoporosis without current pathological fracture: Secondary | ICD-10-CM

## 2017-08-07 DIAGNOSIS — H9193 Unspecified hearing loss, bilateral: Secondary | ICD-10-CM
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
## 2017-08-07 NOTE — Patient Instructions (Addendum)
IF you received an x-ray today, you will receive an invoice from [facility]. Please contact [facility] at [phone] with questions or concerns regarding your invoice.   IF you received labwork today, you will receive an invoice from [place]. Please contact [facility] at [phone] with questions or concerns regarding your invoice.   Our billing staff will not be able to assist you with questions regarding bills from these companies.  You will be contacted with the lab results as soon as they are available. The fastest way to get your results is to activate your My Chart account. Instructions are located on the last page of this paperwork. If you have not heard from [place] regarding the results in 2 weeks, please contact this office.     Hearing Loss Hearing loss is a partial or total loss of the ability to hear. This can be temporary or permanent, and it can happen in one or both ears. Hearing loss may be referred to as deafness. Medical care is necessary to treat hearing loss properly and to prevent the condition from getting worse. Your hearing may partially or completely come back, depending on what caused your hearing loss and how severe it is. In some cases, hearing loss is permanent. What are the causes? Common causes of hearing loss include:  Too much wax in the ear canal.  Infection of the ear canal or middle ear.  Fluid in the middle ear.  Injury to the ear or surrounding area.  An object stuck in the ear.  Prolonged exposure to loud sounds, such as music.  Less common causes of hearing loss include:  Tumors in the ear.  Viral or bacterial infections, such as meningitis.  A hole in the eardrum (perforated eardrum).  Problems with the hearing nerve that sends signals between the brain and the ear.  Certain medicines.  What are the signs or symptoms? Symptoms of this condition may include:  Difficulty telling the difference between  sounds.  Difficulty following a conversation when there is background noise.  Lack of response to sounds in your environment. This may be most noticeable when you do not respond to startling sounds.  Needing to turn up the volume on the television, radio, etc.  Ringing in the ears.  Dizziness.  Pain in the ears.  How is this diagnosed? This condition is diagnosed based on a physical exam and a hearing test (audiometry). The audiometry test will be performed by a hearing specialist (audiologist). You may also be referred to an ear, nose, and throat (ENT) specialist (otolaryngologist). How is this treated? Treatment for recent onset of hearing loss may include:  Ear wax removal.  Being prescribed medicines to prevent infection (antibiotics).  Being prescribed medicines to reduce inflammation (corticosteroids).  Follow these instructions at home:  If you were prescribed an antibiotic medicine, take it as told by your health care provider. Do not stop taking the antibiotic even if you start to feel better.  Take over-the-counter and prescription medicines only as told by your health care provider.  Avoid loud noises.  Return to your normal activities as told by your health care provider. Ask your health care provider what activities are safe for you.  Keep all follow-up visits as told by your health care provider. This is important. Contact a health care provider if:  You feel dizzy.  You develop new symptoms.  You vomit or feel nauseous.  You have a fever. Get help right away if:  You develop sudden changes in your vision.  You have severe ear pain.  You have new or increased weakness.  You have a severe headache. This information is not intended to replace advice given to you by your health care provider. Make sure you discuss any questions you have with your health care provider. Document Released: 08/05/2005 Document Revised: 01/11/2016 Document Reviewed:  12/21/2014 Elsevier Interactive Patient Education  2018 [employer]. Alendronate tablets What is this medicine? ALENDRONATE (a LEN droe nate) slows calcium loss from bones. It helps to make normal healthy bone and to slow bone loss in people with Paget's disease and osteoporosis. It may be used in others at risk for bone loss. This medicine may be used for other purposes; ask your health care provider or pharmacist if you have questions. COMMON BRAND NAME(S): Fosamax What should I tell my health care provider before I take this medicine? They need to know if you have any of these conditions: -dental disease -esophagus, stomach, or intestine problems, like acid reflux or GERD -kidney disease -low blood calcium -low vitamin D -problems sitting or standing 30 minutes -trouble swallowing -an unusual or allergic reaction to alendronate, other medicines, foods, dyes, or preservatives -pregnant or trying to get pregnant -breast-feeding How should I use this medicine? You must take this medicine exactly as directed or you will lower the amount of the medicine you absorb into your body or you may cause yourself harm. Take this medicine by mouth first thing in the morning, after you are up for the day. Do not eat or drink anything before you take your medicine. Swallow the tablet with a full glass (6 to 8 fluid ounces) of plain water. Do not take this medicine with any other drink. Do not chew or crush the tablet. After taking this medicine, do not eat breakfast, drink, or take any medicines or vitamins for at least 30 minutes. Sit or stand up for at least 30 minutes after you take this medicine; do not lie down. Do not take your medicine more often than directed. Talk to your pediatrician regarding the use of this medicine in children. Special care may be needed. Overdosage: If you think you have taken too much of this medicine contact a poison control center or emergency room at once. NOTE: This  medicine is only for you. Do not share this medicine with others. What if I miss a dose? If you miss a dose, do not take it later in the day. Continue your normal schedule starting the next morning. Do not take double or extra doses. What may interact with this medicine? -aluminum hydroxide -antacids -aspirin -calcium supplements -drugs for inflammation like ibuprofen, naproxen, and others -iron supplements -magnesium supplements -vitamins with minerals This list may not describe all possible interactions. Give your health care provider a list of all the medicines, herbs, non-prescription drugs, or dietary supplements you use. Also tell them if you smoke, drink alcohol, or use illegal drugs. Some items may interact with your medicine. What should I watch for while using this medicine? Visit your doctor or health care professional for regular checks ups. It may be some time before you see benefit from this medicine. Do not stop taking your medicine except on your doctor's advice. Your doctor or health care professional may order blood tests and other tests to see how you are doing. You should make sure you get enough calcium and vitamin D while you are taking this medicine, unless your doctor tells you not  to. Discuss the foods you eat and the vitamins you take with your health care professional. Some people who take this medicine have severe bone, joint, and/or muscle pain. This medicine may also increase your risk for a broken thigh bone. Tell your doctor right away if you have pain in your upper leg or groin. Tell your doctor if you have any pain that does not go away or that gets worse. This medicine can make you more sensitive to the sun. If you get a rash while taking this medicine, sunlight may cause the rash to get worse. Keep out of the sun. If you cannot avoid being in the sun, wear protective clothing and use sunscreen. Do not use sun lamps or tanning beds/booths. What side effects may I  notice from receiving this medicine? Side effects that you should report to your doctor or health care professional as soon as possible: -allergic reactions like skin rash, itching or hives, swelling of the face, lips, or tongue -black or tarry stools -bone, muscle or joint pain -changes in vision -chest pain -heartburn or stomach pain -jaw pain, especially after dental work -pain or trouble when swallowing -redness, blistering, peeling or loosening of the skin, including inside the mouth Side effects that usually do not require medical attention (report to your doctor or health care professional if they continue or are bothersome): -changes in taste -diarrhea or constipation -eye pain or itching -headache -nausea or vomiting -stomach gas or fullness This list may not describe all possible side effects. Call your doctor for medical advice about side effects. You may report side effects to FDA at 1-800-FDA-1088. Where should I keep my medicine? Keep out of the reach of children. Store at room temperature of 15 and 30 degrees C (59 and 86 degrees F). Throw away any unused medicine after the expiration date. NOTE: This sheet is a summary. It may not cover all possible information. If you have questions about this medicine, talk to your doctor, pharmacist, or health care provider.  2018 Elsevier/Gold Standard (2011-02-01 08:56:09)

## 2017-08-08 LAB — CBC
Hematocrit: 38.5 % (ref 34.0–46.6)
Hemoglobin: 13.4 g/dL (ref 11.1–15.9)
MCH: 33.3 pg — ABNORMAL HIGH (ref 26.6–33.0)
MCHC: 34.8 g/dL (ref 31.5–35.7)
MCV: 96 fL (ref 79–97)
PLATELETS: 419 10*3/uL — AB (ref 150–379)
RBC: 4.03 x10E6/uL (ref 3.77–5.28)
RDW: 14.4 % (ref 12.3–15.4)
WBC: 7.6 10*3/uL (ref 3.4–10.8)

## 2017-08-08 LAB — VITAMIN D 25 HYDROXY (VIT D DEFICIENCY, FRACTURES): VIT D 25 HYDROXY: 10.6 ng/mL — AB (ref 30.0–100.0)

## 2017-08-08 LAB — TSH: TSH: 5.29 u[IU]/mL — AB (ref 0.450–4.500)

## 2017-08-08 LAB — PTH, INTACT AND CALCIUM: PTH: 42 pg/mL (ref 15–65)

## 2017-08-08 LAB — COMPREHENSIVE METABOLIC PANEL
ALK PHOS: 86 IU/L (ref 39–117)
ALT: 12 IU/L (ref 0–32)
AST: 11 IU/L (ref 0–40)
Albumin/Globulin Ratio: 2.2 (ref 1.2–2.2)
Albumin: 4.9 g/dL — ABNORMAL HIGH (ref 3.6–4.8)
BUN/Creatinine Ratio: 18 (ref 12–28)
BUN: 9 mg/dL (ref 8–27)
Bilirubin Total: 0.2 mg/dL (ref 0.0–1.2)
CO2: 20 mmol/L (ref 20–29)
CREATININE: 0.5 mg/dL — AB (ref 0.57–1.00)
Calcium: 9.7 mg/dL (ref 8.7–10.3)
Chloride: 103 mmol/L (ref 96–106)
GFR calc Af Amer: 117 mL/min/{1.73_m2} (ref >59)
GFR calc non Af Amer: 101 mL/min/{1.73_m2} (ref >59)
GLUCOSE: 85 mg/dL (ref 65–99)
Globulin, Total: 2.2 g/dL (ref 1.5–4.5)
Potassium: 4.6 mmol/L (ref 3.5–5.2)
SODIUM: 138 mmol/L (ref 134–144)
Total Protein: 7.1 g/dL (ref 6.0–8.5)

## 2017-08-09 ENCOUNTER — Encounter: Payer: Self-pay | Admitting: Family Medicine

## 2017-08-09 DIAGNOSIS — H9193 Unspecified hearing loss, bilateral: Secondary | ICD-10-CM | POA: Insufficient documentation

## 2017-08-09 DIAGNOSIS — M810 Age-related osteoporosis without current pathological fracture: Secondary | ICD-10-CM | POA: Insufficient documentation

## 2017-08-09 MED ORDER — VITAMIN D (ERGOCALCIFEROL) 1.25 MG (50000 UNIT) PO CAPS: 50000.0000 [IU] | ORAL_CAPSULE | ORAL | 1 refills | Status: DC

## 2017-08-09 MED ORDER — ALENDRONATE SODIUM 70 MG PO TABS: 70.0000 mg | ORAL_TABLET | ORAL | 3 refills | Status: DC

## 2017-08-09 NOTE — Assessment & Plan Note (Signed)
Age related seemingly conduction loss. Will send to ENT

## 2017-08-09 NOTE — Assessment & Plan Note (Signed)
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 Discussed following up with dentist for xrays of the jaw Discussed common side effects like pill esophagitis, hair thinning and bone pain, skin rash and a more rare but dangerous side effect of jaw necrosis.

## 2017-09-02 DIAGNOSIS — H903 Sensorineural hearing loss, bilateral: Secondary | ICD-10-CM | POA: Diagnosis not present

## 2017-09-02 DIAGNOSIS — H6983 Other specified disorders of Eustachian tube, bilateral: Secondary | ICD-10-CM | POA: Diagnosis not present

## 2017-12-26 ENCOUNTER — Ambulatory Visit (INDEPENDENT_AMBULATORY_CARE_PROVIDER_SITE_OTHER): Payer: PPO | Admitting: Physician Assistant

## 2017-12-26 ENCOUNTER — Other Ambulatory Visit: Payer: Self-pay

## 2017-12-26 ENCOUNTER — Encounter: Payer: Self-pay | Admitting: Physician Assistant

## 2017-12-26 ENCOUNTER — Ambulatory Visit: Payer: Self-pay | Admitting: *Deleted

## 2017-12-26 VITALS — BP 112/60 | HR 102 | Temp 99.0°F | Resp 16 | Ht 65.0 in | Wt 169.0 lb

## 2017-12-26 DIAGNOSIS — H9202 Otalgia, left ear: Secondary | ICD-10-CM

## 2017-12-26 DIAGNOSIS — J019 Acute sinusitis, unspecified: Secondary | ICD-10-CM | POA: Diagnosis not present

## 2017-12-26 DIAGNOSIS — F17200 Nicotine dependence, unspecified, uncomplicated: Secondary | ICD-10-CM | POA: Diagnosis not present

## 2017-12-26 MED ORDER — AZELASTINE HCL 0.1 % NA SOLN: 2.0000 | Freq: Two times a day (BID) | NASAL | 0 refills | Status: DC

## 2017-12-26 MED ORDER — AMOXICILLIN-POT CLAVULANATE 875-125 MG PO TABS: 1.0000 | ORAL_TABLET | Freq: Two times a day (BID) | ORAL | 0 refills | Status: AC

## 2017-12-26 MED ORDER — GUAIFENESIN ER 1200 MG PO TB12: 1.0000 | ORAL_TABLET | Freq: Two times a day (BID) | ORAL | 1 refills | Status: DC | PRN

## 2017-12-26 NOTE — Telephone Encounter (Signed)
I returned her call regarding her left ear feeling full and beginning to hurt. She started out with a cold a week ago.   Now she is having fullness around her eyes and the earache.   See triage notes below.  I made her an appt with Harrison Mons, PA-C for today at 3:40.      Reason for Disposition . Earache  (Exceptions: brief ear pain of < 60 minutes duration, earache occurring during air travel  Answer Assessment - Initial Assessment Questions 1. LOCATION: "Which ear is involved?"     Left ear is stopped up and full.   My face feels full like a sinus infection around  both my eyes. 2. ONSET: "When did the ear start hurting"      Today.   3 hours ago.   Not taken anything for it. 3. SEVERITY: "How bad is the pain?"  (Scale 1-10; mild, moderate or severe)   - MILD (1-3): doesn't interfere with normal activities    - MODERATE (4-7): interferes with normal activities or awakens from sleep    - SEVERE (8-10): excruciating pain, unable to do any normal activities      *No Answer* 4. URI SYMPTOMS: " Do you have a runny nose or cough?"     I'm coughing too.   Dry mostly.  Taking Mucinex.   At first my nose was runny but not now.   5. FEVER: "Do you have a fever?" If so, ask: "What is your temperature, how was it measured, and when did it start?"     99.8 fever.    6. CAUSE: "Have you been swimming recently?", "How often do you use Q-TIPS?", "Have you had any recent air travel or scuba diving?"     No 7. OTHER SYMPTOMS: "Do you have any other symptoms?" (e.g., headache, stiff neck, dizziness, vomiting, runny nose, decreased hearing)     Headaches that come and go.   It goes away with a pain reliever.   8. PREGNANCY: "Is there any chance you are pregnant?" "When was your last menstrual period?"     N/A  Protocols used: EARACHE-A-AH

## 2017-12-26 NOTE — Patient Instructions (Addendum)
Get plenty of rest and drink at least 64 ounces of water daily.   IF you received an x-ray today, you will receive an invoice from [facility]. Please contact [facility] at [phone] with questions or concerns regarding your invoice.   IF you received labwork today, you will receive an invoice from [place]. Please contact [facility] at [phone] with questions or concerns regarding your invoice.   Our billing staff will not be able to assist you with questions regarding bills from these companies.  You will be contacted with the lab results as soon as they are available. The fastest way to get your results is to activate your My Chart account. Instructions are located on the last page of this paperwork. If you have not heard from [place] regarding the results in 2 weeks, please contact this office.    Did you know that you begin to benefit from quitting smoking within the first twenty minutes? It's TRUE.  At 20 minutes: -blood pressure decreases -pulse rate drops -body temperature of hands and feet increases  At 8 hours: -carbon monoxide level in blood drops to normal -oxygen level in blood increases to normal  At 24 hours: -the chance of heart attack decreases  At 48 hours: -nerve endings start regrowing -ability to smell and taste is enhanced  2 weeks-3 months: -circulation improves -walking becomes easier -lung function improves  1-9 months: -coughing, sinus congestion, fatigue and shortness of breath decreases  1 year: -excess risk of heart disease is decreased to HALF that of a smoker  5 years: Stroke risk is reduced to that of people who have never smoked  10 years: -risk of lung cancer drops to as little as half that of continuing smokers -risk of cancer of the mouth, throat, esophagus, bladder, kidney and pancreas decreases -risk of ulcer decreases  15 years -risk of heart disease is now similar to that of people who have never  smoked -risk of death returns to nearly the level of people who have never smoked
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
## 2018-02-05 ENCOUNTER — Ambulatory Visit: Payer: Self-pay | Admitting: Family Medicine

## 2018-02-05 NOTE — Telephone Encounter (Signed)
  I returned her call.   She is still having problems with her left ear feeling stuffed up.   She was seen on 12/26/17 and took the antibiotics, etc prescribed.   Her right ear cleared up but the left ear continues to be stuffed up. She mentioned that she has seen an ENT doctor.   Harrison Mons who she saw on 5/10 is no longer with the practice.    After talking with her she has decided to call her ENT doctor since this has been going on for a while.  I let her know to please call us back if that did not work out for her or she needed a referral.   She verbalized understanding and was agreeable to this plan.   Reason for Disposition . [1] Taking antibiotic > 72 hours (3 days) and [2] pain persists or recurs    Was seen 12/26/17.  Took antibiotics.  Right ear cleared up but left ear still "stopped up".  Answer Assessment - Initial Assessment Questions 1. ANTIBIOTIC: "What antibiotic are you receiving?" "How many times per day?"  She was seen by Harrison Mons at [facility] on [address] on 12/26/17  For this same issue; her left ear was stuffed up.   Was given antibiotics, nasal spay, and Mucinex.   The right ear cleared up.   The left ear is still feeling stuffed up.   No pain.   No sinus problems now.     2. ONSET: "When was the antibiotic started?"     I was seen 12/26/17 by Harrison Mons at [place] at [facility].  I took the antibiotics.    3. PAIN: "How bad is the pain?"   (Scale 1-10; mild, moderate or severe)   - MILD (1-3): doesn't interfere with normal activities    - MODERATE (4-7): interferes with normal activities or awakens from sleep    - SEVERE (8-10): excruciating pain, unable to do any normal activities      No pain 4. FEVER: "Do you have a fever?" If so, ask: "What is your temperature, how was it measured, and when did it start?"     No 5. DISCHARGE: "Is there any discharge from the ear?"     No.    6. OTHER SYMPTOMS: "Do you have any other symptoms?" (e.g., headache, stiff neck,  dizziness, vomiting, runny nose)     No. 7. PREGNANCY: "Is there any chance you are pregnant?" "When was your last menstrual period?"     Not asked due to age  Protocols used: EAR - OTITIS MEDIA FOLLOW-UP CALL-A-AH

## 2018-04-03 DIAGNOSIS — H6983 Other specified disorders of Eustachian tube, bilateral: Secondary | ICD-10-CM | POA: Diagnosis not present

## 2018-04-03 DIAGNOSIS — H903 Sensorineural hearing loss, bilateral: Secondary | ICD-10-CM | POA: Diagnosis not present

## 2018-04-03 DIAGNOSIS — H6121 Impacted cerumen, right ear: Secondary | ICD-10-CM | POA: Diagnosis not present

## 2018-04-14 ENCOUNTER — Telehealth: Payer: Self-pay | Admitting: Family Medicine

## 2018-04-14 MED ORDER — PRAVASTATIN SODIUM 20 MG PO TABS: 20.0000 mg | ORAL_TABLET | Freq: Every day | ORAL | 3 refills | Status: DC

## 2018-04-14 NOTE — Telephone Encounter (Signed)
Copied from [place] [phone] [phone]. Topic: Quick Communication - Rx Refill/Question >> Apr 14, 2018 11:30 AM Yvette Rack wrote: Medication: pravastatin (PRAVACHOL) 20 MG tablet  Has the patient contacted their pharmacy? no  Preferred Pharmacy (with phone number or street name): [facility], [place] - [place], [place] - [address] [phone] (Phone) [phone] (Fax)  Agent: Please be advised that RX refills may take up to 3 business days. We ask that you follow-up with your pharmacy.

## 2018-07-14 ENCOUNTER — Other Ambulatory Visit: Payer: Self-pay

## 2018-07-14 ENCOUNTER — Ambulatory Visit: Payer: Self-pay | Admitting: *Deleted

## 2018-07-14 ENCOUNTER — Encounter: Payer: Self-pay | Admitting: Physician Assistant

## 2018-07-14 ENCOUNTER — Ambulatory Visit (INDEPENDENT_AMBULATORY_CARE_PROVIDER_SITE_OTHER): Payer: PPO | Admitting: Physician Assistant

## 2018-07-14 VITALS — BP 138/72 | HR 100 | Temp 98.3°F | Resp 16 | Ht 66.0 in | Wt 172.2 lb

## 2018-07-14 DIAGNOSIS — R062 Wheezing: Secondary | ICD-10-CM

## 2018-07-14 DIAGNOSIS — J069 Acute upper respiratory infection, unspecified: Secondary | ICD-10-CM

## 2018-07-14 MED ORDER — PREDNISONE 20 MG PO TABS: 40.0000 mg | ORAL_TABLET | Freq: Every day | ORAL | 0 refills | Status: AC

## 2018-07-14 MED ORDER — ALBUTEROL SULFATE (2.5 MG/3ML) 0.083% IN NEBU
2.5000 mg | INHALATION_SOLUTION | Freq: Once | RESPIRATORY_TRACT | Status: AC
  Administered 2018-07-14: 2.5 mg via RESPIRATORY_TRACT

## 2018-07-14 MED ORDER — BENZONATATE 100 MG PO CAPS: 100.0000 mg | ORAL_CAPSULE | Freq: Three times a day (TID) | ORAL | 0 refills | Status: DC | PRN

## 2018-07-14 MED ORDER — ALBUTEROL SULFATE HFA 108 (90 BASE) MCG/ACT IN AERS: 2.0000 | INHALATION_SPRAY | RESPIRATORY_TRACT | 1 refills | Status: DC | PRN

## 2018-07-14 MED ORDER — IPRATROPIUM BROMIDE 0.02 % IN SOLN
0.5000 mg | Freq: Once | RESPIRATORY_TRACT | Status: AC
  Administered 2018-07-14: 0.5 mg via RESPIRATORY_TRACT

## 2018-07-14 NOTE — Patient Instructions (Addendum)
  Prednisone- Start taking this tomorrow. This will continue to keep your airway nice and open.  Albuterol inhaler - use this as needed every 4-6 hours.  Tessalon is for cough during the day. This should not make you drowsy.   Come back in 7-10 days if you are not improving  Stay well hydrated. Get lost of rest. Wash your hands often.   -Foods that can help speed recovery: honey, garlic, chicken soup, elderberries, green tea.  -Supplements that can help speed recovery: vitamin C, zinc, elderberry extract, quercetin, ginseng, selenium -Supplement with prebiotics and probiotics:   For sore throat try using a honey-based tea. Use 3 teaspoons of honey with juice squeezed from half lemon. Place shaved pieces of ginger into 1/2-1 cup of water and warm over stove top. Then mix the ingredients and repeat every 4 hours as needed.  Cough Syrup Recipe: Sweet Lemon & Honey Thyme  Ingredients . a handful of fresh thyme sprigs   . 1 pint of water (2 cups)  . 1/2 cup honey (raw is best, but regular will do)  . 1/2 lemon chopped Instructions 1. Place the lemon in the pint jar and cover with the honey. The honey will macerate the lemons and draw out liquids which taste so delicious! 2. Meanwhile, toss the thyme leaves into a saucepan and cover them with the water. 3. Bring the water to a gentle simmer and reduce it to half, about a cup of tea. 4. When the tea is reduced and cooled a bit, strain the sprigs & leaves, add it into the pint jar and stir it well. 5. Give it a shake and use a spoonful as needed. 6. Store your homemade cough syrup in the refrigerator for about a month.  What causes a cough?  In adults, common causes of a cough include: ?An infection of the airways or lungs (such as the common cold) ?Postnasal drip - Postnasal drip is when mucus from the nose drips down or flows along the back of the throat. Postnasal drip can happen when people have: .A cold .Allergies .A sinus infection -  The sinuses are hollow areas in the bones of the face that open into the nose. ?Lung conditions, like asthma and chronic obstructive pulmonary disease (COPD) - Both of these conditions can make it hard to breathe. COPD is usually caused by smoking. ?Acid reflux - Acid reflux is when the acid that is normally in your stomach backs up into your esophagus (the tube that carries food from your mouth to your stomach). ?A side effect from blood pressure medicines called "ACE inhibitors" ?Smoking cigarettes  Is there anything I can do on my own to get rid of my cough?  Yes. To help get rid of your cough, you can: ?Use a humidifier in your bedroom ?Use an over-the-counter cough medicine, or suck on cough drops or hard candy ?Stop smoking, if you smoke ?If you have allergies, avoid the things you are allergic to (like pollen, dust, animals, or mold) If you have acid reflux, your doctor or nurse will tell you which lifestyle changes can help reduce symptoms.

## 2018-07-14 NOTE — Telephone Encounter (Signed)
Pt called with having a cold for about 2 weeks. She feels fatigue. Denies fever. Has nasal congestion. Sounds stuffy. Has taken liquid mucinex for am and pm but made her constipated. When she stopped taking it, she was not constipated. She is drinking lots of fluids. Advised on nasal saline washings and using a humidifier.  She is requesting an appointment for today.  Flow at [facility] notified regarding appointment availability.  Appointment scheduled.  Routing to flow at [facility].   Answer Assessment - Initial Assessment Questions 1. ONSET: "When did the cough begin?"      2 weeks ago 2. SEVERITY: "How bad is the cough today?"      Not bad 3. RESPIRATORY DISTRESS: "Describe your breathing."      Nasal congestion 4. FEVER: "Do you have a fever?" If so, ask: "What is your temperature, how was it measured, and when did it start?"     fever 5. HEMOPTYSIS: "Are you coughing up any blood?" If so ask: "How much?" (flecks, streaks, tablespoons, etc.)     no 6. TREATMENT: "What have you done so far to treat the cough?" (e.g., meds, fluids, humidifier)     Fluids, meds 7. CARDIAC HISTORY: "Do you have any history of heart disease?" (e.g., heart attack, congestive heart failure)      no 8. LUNG HISTORY: "Do you have any history of lung disease?"  (e.g., pulmonary embolus, asthma, emphysema)     no 9. PE RISK FACTORS: "Do you have a history of blood clots?" (or: recent major surgery, recent prolonged travel, bedridden)     no 10. OTHER SYMPTOMS: "Do you have any other symptoms? (e.g., runny nose, wheezing, chest pain)       no 11. PREGNANCY: "Is there any chance you are pregnant?" "When was your last menstrual period?"       no 12. TRAVEL: "Have you traveled out of the country in the last month?" (e.g., travel history, exposures)  Protocols used: COUGH - ACUTE NON-PRODUCTIVE-A-AH
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
## 2018-09-17 ENCOUNTER — Ambulatory Visit: Payer: Self-pay | Admitting: *Deleted
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
## 2018-09-18 ENCOUNTER — Ambulatory Visit (INDEPENDENT_AMBULATORY_CARE_PROVIDER_SITE_OTHER): Payer: PPO | Admitting: Family Medicine

## 2018-09-18 ENCOUNTER — Encounter: Payer: Self-pay | Admitting: Family Medicine

## 2018-09-18 ENCOUNTER — Other Ambulatory Visit: Payer: Self-pay

## 2018-09-18 VITALS — BP 130/72 | HR 82 | Temp 98.4°F | Ht 66.0 in | Wt 175.8 lb

## 2018-09-18 DIAGNOSIS — L02214 Cutaneous abscess of groin: Secondary | ICD-10-CM | POA: Diagnosis not present

## 2018-09-18 MED ORDER — DOXYCYCLINE HYCLATE 100 MG PO TABS: 100.0000 mg | ORAL_TABLET | Freq: Two times a day (BID) | ORAL | 0 refills | Status: DC

## 2018-09-18 MED ORDER — MUPIROCIN 2 % EX OINT: 1.0000 "application " | TOPICAL_OINTMENT | Freq: Three times a day (TID) | CUTANEOUS | 0 refills | Status: DC

## 2018-09-18 MED ORDER — ESCITALOPRAM OXALATE 20 MG PO TABS: 20.0000 mg | ORAL_TABLET | Freq: Every day | ORAL | 0 refills | Status: DC

## 2018-09-18 NOTE — Patient Instructions (Addendum)
I suspect a small abscess in the left groin area but that appears to be improving.  Can try Bactroban ointment to the area a few times per day, warm compresses with gentle pressure to express any exudate, but if not improving into next week, can start doxycycline oral antibiotic as we discussed.  Please follow-up if any worsening symptoms or spreading redness.  I refilled Lexapro temporarily, but keep follow-up with your primary care provider.  Thank you for coming in today   Skin Abscess  A skin abscess is an infected area on or under your skin that contains a collection of pus and other material. An abscess may also be called a furuncle, carbuncle, or boil. An abscess can occur in or on almost any part of your body. Some abscesses break open (rupture) on their own. Most continue to get worse unless they are treated. The infection can spread deeper into the body and eventually into your blood, which can make you feel ill. Treatment usually involves draining the abscess. What are the causes? An abscess occurs when germs, like bacteria, pass through your skin and cause an infection. This may be caused by:  A scrape or cut on your skin.  A puncture wound through your skin, including a needle injection or insect bite.  Blocked oil or sweat glands.  Blocked and infected hair follicles.  A cyst that forms beneath your skin (sebaceous cyst) and becomes infected. What increases the risk? This condition is more likely to develop in people who:  Have a weak body defense system (immune system).  Have diabetes.  Have dry and irritated skin.  Get frequent injections or use illegal IV drugs.  Have a foreign body in a wound, such as a splinter.  Have problems with their lymph system or veins. What are the signs or symptoms? Symptoms of this condition include:  A painful, firm bump under the skin.  A bump with pus at the top. This may break through the skin and drain. Other symptoms  include:  Redness surrounding the abscess site.  Warmth.  Swelling of the lymph nodes (glands) near the abscess.  Tenderness.  A sore on the skin. How is this diagnosed? This condition may be diagnosed based on:  A physical exam.  Your medical history.  A sample of pus. This may be used to find out what is causing the infection.  Blood tests.  Imaging tests, such as an ultrasound, CT scan, or MRI. How is this treated? A small abscess that drains on its own may not need treatment. Treatment for larger abscesses may include:  Moist heat or heat pack applied to the area several times a day.  A procedure to drain the abscess (incision and drainage).  Antibiotic medicines. For a severe abscess, you may first get antibiotics through an IV and then change to antibiotics by mouth. Follow these instructions at home: Medicines   Take over-the-counter and prescription medicines only as told by your health care provider.  If you were prescribed an antibiotic medicine, take it as told by your health care provider. Do not stop taking the antibiotic even if you start to feel better. Abscess care   If you have an abscess that has not drained, apply heat to the affected area. Use the heat source that your health care provider recommends, such as a moist heat pack or a heating pad. ? Place a towel between your skin and the heat source. ? Leave the heat on for 20-30 minutes. ?  Remove the heat if your skin turns bright red. This is especially important if you are unable to feel pain, heat, or cold. You may have a greater risk of getting burned.  Follow instructions from your health care provider about how to take care of your abscess. Make sure you: ? Cover the abscess with a bandage (dressing). ? Change your dressing or gauze as told by your health care provider. ? Wash your hands with soap and water before you change the dressing or gauze. If soap and water are not available, use hand  sanitizer.  Check your abscess every day for signs of a worsening infection. Check for: ? More redness, swelling, or pain. ? More fluid or blood. ? Warmth. ? More pus or a bad smell. General instructions  To avoid spreading the infection: ? Do not share personal care items, towels, or hot tubs with others. ? Avoid making skin contact with other people.  Keep all follow-up visits as told by your health care provider. This is important. Contact a health care provider if you have:  More redness, swelling, or pain around your abscess.  More fluid or blood coming from your abscess.  Warm skin around your abscess.  More pus or a bad smell coming from your abscess.  A fever.  Muscle aches.  Chills or a general ill feeling. Get help right away if you:  Have severe pain.  See red streaks on your skin spreading away from the abscess. Summary  A skin abscess is an infected area on or under your skin that contains a collection of pus and other material.  A small abscess that drains on its own may not need treatment.  Treatment for larger abscesses may include having a procedure to drain the abscess and taking an antibiotic. This information is not intended to replace advice given to you by your health care provider. Make sure you discuss any questions you have with your health care provider. Document Released: 05/15/2005 Document Revised: 09/18/2017 Document Reviewed: 09/18/2017 Elsevier Interactive Patient Education  [employer].   If you have lab work done today you will be contacted with your lab results within the next 2 weeks.  If you have not heard from [place] then please contact us. The fastest way to get your results is to register for My Chart.   IF you received an x-ray today, you will receive an invoice from [facility]. Please contact [facility] at [phone] with questions or concerns regarding your invoice.   IF you received labwork today,  you will receive an invoice from [facility]. Please contact [facility] at [phone] with questions or concerns regarding your invoice.   Our billing staff will not be able to assist you with questions regarding bills from these companies.  You will be contacted with the lab results as soon as they are available. The fastest way to get your results is to activate your My Chart account. Instructions are located on the last page of this paperwork. If you have not heard from [place] regarding the results in 2 weeks, please contact this office.
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
## 2018-09-19 ENCOUNTER — Encounter: Payer: Self-pay | Admitting: Family Medicine

## 2018-09-20 LAB — WOUND CULTURE

## 2018-10-02 ENCOUNTER — Other Ambulatory Visit: Payer: Self-pay | Admitting: Family Medicine

## 2018-10-05 ENCOUNTER — Telehealth: Payer: Self-pay | Admitting: Family Medicine

## 2018-10-14 DIAGNOSIS — F4320 Adjustment disorder, unspecified: Secondary | ICD-10-CM | POA: Diagnosis not present

## 2018-10-28 ENCOUNTER — Encounter: Payer: Self-pay | Admitting: Family Medicine

## 2018-10-28 ENCOUNTER — Ambulatory Visit (INDEPENDENT_AMBULATORY_CARE_PROVIDER_SITE_OTHER): Payer: PPO | Admitting: Family Medicine

## 2018-10-28 ENCOUNTER — Other Ambulatory Visit: Payer: Self-pay

## 2018-10-28 VITALS — BP 128/79 | HR 60 | Temp 97.9°F | Ht 66.0 in | Wt 174.4 lb

## 2018-10-28 DIAGNOSIS — Z130 Encounter for screening for diseases of the blood and blood-forming organs and certain disorders involving the immune mechanism: Secondary | ICD-10-CM | POA: Diagnosis not present

## 2018-10-28 DIAGNOSIS — Z0001 Encounter for general adult medical examination with abnormal findings: Secondary | ICD-10-CM

## 2018-10-28 DIAGNOSIS — Z0000 Encounter for general adult medical examination without abnormal findings: Secondary | ICD-10-CM

## 2018-10-28 DIAGNOSIS — Z13228 Encounter for screening for other metabolic disorders: Secondary | ICD-10-CM

## 2018-10-28 DIAGNOSIS — Z23 Encounter for immunization: Secondary | ICD-10-CM | POA: Diagnosis not present

## 2018-10-28 DIAGNOSIS — E559 Vitamin D deficiency, unspecified: Secondary | ICD-10-CM | POA: Diagnosis not present

## 2018-10-28 DIAGNOSIS — Z1321 Encounter for screening for nutritional disorder: Secondary | ICD-10-CM | POA: Diagnosis not present

## 2018-10-28 DIAGNOSIS — Z1329 Encounter for screening for other suspected endocrine disorder: Secondary | ICD-10-CM

## 2018-10-28 DIAGNOSIS — F4320 Adjustment disorder, unspecified: Secondary | ICD-10-CM | POA: Diagnosis not present

## 2018-10-28 DIAGNOSIS — Z1159 Encounter for screening for other viral diseases: Secondary | ICD-10-CM | POA: Diagnosis not present

## 2018-10-28 MED ORDER — ALENDRONATE SODIUM 35 MG PO TABS: 70.0000 mg | ORAL_TABLET | ORAL | 3 refills | Status: DC

## 2018-10-28 MED ORDER — ESCITALOPRAM OXALATE 20 MG PO TABS: 20.0000 mg | ORAL_TABLET | Freq: Every day | ORAL | 1 refills | Status: DC
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
## 2018-10-28 NOTE — Patient Instructions (Addendum)
If you have lab work done today you will be contacted with your lab results within the next 2 weeks.  If you have not heard from [place] then please contact us. The fastest way to get your results is to register for My Chart.   IF you received an x-ray today, you will receive an invoice from [facility]. Please contact [facility] at [phone] with questions or concerns regarding your invoice.   IF you received labwork today, you will receive an invoice from [facility]. Please contact [facility] at [phone] with questions or concerns regarding your invoice.   Our billing staff will not be able to assist you with questions regarding bills from these companies.  You will be contacted with the lab results as soon as they are available. The fastest way to get your results is to activate your My Chart account. Instructions are located on the last page of this paperwork. If you have not heard from [place] regarding the results in 2 weeks, please contact this office.     Health Maintenance for Postmenopausal Women Menopause is a normal process in which your reproductive ability comes to an end. This process happens gradually over a span of months to years, usually between the ages of 45 and 17. Menopause is complete when you have missed 12 consecutive menstrual periods. It is important to talk with your health care provider about some of the most common conditions that affect postmenopausal women, such as heart disease, cancer, and bone loss (osteoporosis). Adopting a healthy lifestyle and getting preventive care can help to promote your health and wellness. Those actions can also lower your chances of developing some of these common conditions. What should I know about menopause? During menopause, you may experience a number of symptoms, such as:  Moderate-to-severe hot flashes.  Night sweats.  Decrease in sex drive.  Mood  swings.  Headaches.  Tiredness.  Irritability.  Memory problems.  Insomnia. Choosing to treat or not to treat menopausal changes is an individual decision that you make with your health care provider. What should I know about hormone replacement therapy and supplements? Hormone therapy products are effective for treating symptoms that are associated with menopause, such as hot flashes and night sweats. Hormone replacement carries certain risks, especially as you become older. If you are thinking about using estrogen or estrogen with progestin treatments, discuss the benefits and risks with your health care provider. What should I know about heart disease and stroke? Heart disease, heart attack, and stroke become more likely as you age. This may be due, in part, to the hormonal changes that your body experiences during menopause. These can affect how your body processes dietary fats, triglycerides, and cholesterol. Heart attack and stroke are both medical emergencies. There are many things that you can do to help prevent heart disease and stroke:  Have your blood pressure checked at least every 1-2 years. High blood pressure causes heart disease and increases the risk of stroke.  If you are 32-60 years old, ask your health care provider if you should take aspirin to prevent a heart attack or a stroke.  Do not use any tobacco products, including cigarettes, chewing tobacco, or electronic cigarettes. If you need help quitting, ask your health care provider.  It is important to eat a healthy diet and maintain a healthy weight. ? Be sure to include plenty of vegetables, fruits, low-fat dairy products, and lean protein. ? Avoid eating foods that are high in solid  fats, added sugars, or salt (sodium).  Get regular exercise. This is one of the most important things that you can do for your health. ? Try to exercise for at least 150 minutes each week. The type of exercise that you do should  increase your heart rate and make you sweat. This is known as moderate-intensity exercise. ? Try to do strengthening exercises at least twice each week. Do these in addition to the moderate-intensity exercise.  Know your numbers.Ask your health care provider to check your cholesterol and your blood glucose. Continue to have your blood tested as directed by your health care provider.  What should I know about cancer screening? There are several types of cancer. Take the following steps to reduce your risk and to catch any cancer development as early as possible. Breast Cancer  Practice breast self-awareness. ? This means understanding how your breasts normally appear and feel. ? It also means doing regular breast self-exams. Let your health care provider know about any changes, no matter how small.  If you are 40 or older, have a clinician do a breast exam (clinical breast exam or CBE) every year. Depending on your age, family history, and medical history, it may be recommended that you also have a yearly breast X-ray (mammogram).  If you have a family history of breast cancer, talk with your health care provider about genetic screening.  If you are at high risk for breast cancer, talk with your health care provider about having an MRI and a mammogram every year.  Breast cancer (BRCA) gene test is recommended for women who have family members with BRCA-related cancers. Results of the assessment will determine the need for genetic counseling and BRCA1 and for BRCA2 testing. BRCA-related cancers include these types: ? Breast. This occurs in males or females. ? Ovarian. ? Tubal. This may also be called fallopian tube cancer. ? Cancer of the abdominal or pelvic lining (peritoneal cancer). ? Prostate. ? Pancreatic. Cervical, Uterine, and Ovarian Cancer Your health care provider may recommend that you be screened regularly for cancer of the pelvic organs. These include your ovaries, uterus, and  vagina. This screening involves a pelvic exam, which includes checking for microscopic changes to the surface of your cervix (Pap test).  For women ages 21-65, health care providers may recommend a pelvic exam and a Pap test every three years. For women ages 30-65, they may recommend the Pap test and pelvic exam, combined with testing for human papilloma virus (HPV), every five years. Some types of HPV increase your risk of cervical cancer. Testing for HPV may also be done on women of any age who have unclear Pap test results.  Other health care providers may not recommend any screening for nonpregnant women who are considered low risk for pelvic cancer and have no symptoms. Ask your health care provider if a screening pelvic exam is right for you.  If you have had past treatment for cervical cancer or a condition that could lead to cancer, you need Pap tests and screening for cancer for at least 20 years after your treatment. If Pap tests have been discontinued for you, your risk factors (such as having a new sexual partner) need to be reassessed to determine if you should start having screenings again. Some women have medical problems that increase the chance of getting cervical cancer. In these cases, your health care provider may recommend that you have screening and Pap tests more often.  If you have a family   history of uterine cancer or ovarian cancer, talk with your health care provider about genetic screening.  If you have vaginal bleeding after reaching menopause, tell your health care provider.  There are currently no reliable tests available to screen for ovarian cancer. Lung Cancer Lung cancer screening is recommended for adults 55-80 years old who are at high risk for lung cancer because of a history of smoking. A yearly low-dose CT scan of the lungs is recommended if you:  Currently smoke.  Have a history of at least 30 pack-years of smoking and you currently smoke or have quit within  the past 15 years. A pack-year is smoking an average of one pack of cigarettes per day for one year. Yearly screening should:  Continue until it has been 15 years since you quit.  Stop if you develop a health problem that would prevent you from having lung cancer treatment. Colorectal Cancer  This type of cancer can be detected and can often be prevented.  Routine colorectal cancer screening usually begins at age 50 and continues through age 75.  If you have risk factors for colon cancer, your health care provider may recommend that you be screened at an earlier age.  If you have a family history of colorectal cancer, talk with your health care provider about genetic screening.  Your health care provider may also recommend using home test kits to check for hidden blood in your stool.  A small camera at the end of a tube can be used to examine your colon directly (sigmoidoscopy or colonoscopy). This is done to check for the earliest forms of colorectal cancer.  Direct examination of the colon should be repeated every 5-10 years until age 75. However, if early forms of precancerous polyps or small growths are found or if you have a family history or genetic risk for colorectal cancer, you may need to be screened more often. Skin Cancer  Check your skin from head to toe regularly.  Monitor any moles. Be sure to tell your health care provider: ? About any new moles or changes in moles, especially if there is a change in a mole's shape or color. ? If you have a mole that is larger than the size of a pencil eraser.  If any of your family members has a history of skin cancer, especially at a young age, talk with your health care provider about genetic screening.  Always use sunscreen. Apply sunscreen liberally and repeatedly throughout the day.  Whenever you are outside, protect yourself by wearing long sleeves, pants, a wide-brimmed hat, and sunglasses. What should I know about  osteoporosis? Osteoporosis is a condition in which bone destruction happens more quickly than new bone creation. After menopause, you may be at an increased risk for osteoporosis. To help prevent osteoporosis or the bone fractures that can happen because of osteoporosis, the following is recommended:  If you are 19-50 years old, get at least 1,000 mg of calcium and at least 600 mg of vitamin D per day.  If you are older than age 50 but younger than age 70, get at least 1,200 mg of calcium and at least 600 mg of vitamin D per day.  If you are older than age 70, get at least 1,200 mg of calcium and at least 800 mg of vitamin D per day. Smoking and excessive alcohol intake increase the risk of osteoporosis. Eat foods that are rich in calcium and vitamin D, and do weight-bearing exercises several times   each week as directed by your health care provider. What should I know about how menopause affects my mental health? Depression may occur at any age, but it is more common as you become older. Common symptoms of depression include:  Low or sad mood.  Changes in sleep patterns.  Changes in appetite or eating patterns.  Feeling an overall lack of motivation or enjoyment of activities that you previously enjoyed.  Frequent crying spells. Talk with your health care provider if you think that you are experiencing depression. What should I know about immunizations? It is important that you get and maintain your immunizations. These include:  Tetanus, diphtheria, and pertussis (Tdap) booster vaccine.  Influenza every year before the flu season begins.  Pneumonia vaccine.  Shingles vaccine. Your health care provider may also recommend other immunizations. This information is not intended to replace advice given to you by your health care provider. Make sure you discuss any questions you have with your health care provider. Document Released: 09/27/2005 Document Revised: 02/23/2016 Document  Reviewed: 05/09/2015 Elsevier Interactive Patient Education  2019 [employer].

## 2018-10-30 LAB — COMPREHENSIVE METABOLIC PANEL
ALT: 19 IU/L (ref 0–32)
AST: 11 IU/L (ref 0–40)
Albumin/Globulin Ratio: 1.9 (ref 1.2–2.2)
Albumin: 4.6 g/dL (ref 3.8–4.8)
Alkaline Phosphatase: 63 IU/L (ref 39–117)
BUN/Creatinine Ratio: 28 (ref 12–28)
BUN: 17 mg/dL (ref 8–27)
Bilirubin Total: 0.2 mg/dL (ref 0.0–1.2)
CO2: 19 mmol/L — ABNORMAL LOW (ref 20–29)
Calcium: 9.8 mg/dL (ref 8.7–10.3)
Chloride: 103 mmol/L (ref 96–106)
Creatinine, Ser: 0.6 mg/dL (ref 0.57–1.00)
GFR calc Af Amer: 109 mL/min/{1.73_m2} (ref >59)
GFR calc non Af Amer: 95 mL/min/{1.73_m2} (ref >59)
Globulin, Total: 2.4 g/dL (ref 1.5–4.5)
Glucose: 89 mg/dL (ref 65–99)
POTASSIUM: 4.6 mmol/L (ref 3.5–5.2)
Sodium: 138 mmol/L (ref 134–144)
Total Protein: 7 g/dL (ref 6.0–8.5)

## 2018-10-30 LAB — TSH: TSH: 5 u[IU]/mL — ABNORMAL HIGH (ref 0.450–4.500)

## 2018-10-30 LAB — VITAMIN D 25 HYDROXY (VIT D DEFICIENCY, FRACTURES): VIT D 25 HYDROXY: 19.5 ng/mL — AB (ref 30.0–100.0)

## 2018-10-30 LAB — HCV AB W REFLEX TO QUANT PCR: HCV Ab: <0.1 s/co ratio (ref 0.0–0.9)

## 2018-10-30 LAB — HCV INTERPRETATION

## 2018-11-19 ENCOUNTER — Ambulatory Visit (INDEPENDENT_AMBULATORY_CARE_PROVIDER_SITE_OTHER): Payer: PPO | Admitting: Family Medicine

## 2018-11-19 ENCOUNTER — Telehealth: Payer: Self-pay | Admitting: *Deleted

## 2018-11-19 ENCOUNTER — Other Ambulatory Visit: Payer: Self-pay

## 2018-11-19 VITALS — Ht 66.0 in | Wt 174.0 lb

## 2018-11-19 DIAGNOSIS — Z0000 Encounter for general adult medical examination without abnormal findings: Secondary | ICD-10-CM
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
## 2018-11-19 NOTE — Patient Instructions (Signed)
Thank you for taking time to come for your Medicare Wellness Visit. I appreciate your ongoing commitment to your health goals. Please review the following plan we discussed and let me know if I can assist you in the future.  Leroy Kennedy LPN   Healthy Eating Following a healthy eating pattern may help you to achieve and maintain a healthy body weight, reduce the risk of chronic disease, and live a long and productive life. It is important to follow a healthy eating pattern at an appropriate calorie level for your body. Your nutritional needs should be met primarily through food by choosing a variety of nutrient-rich foods. What are tips for following this plan? Reading food labels  Read labels and choose the following: ? Reduced or low sodium. ? Juices with 100% fruit juice. ? Foods with low saturated fats and high polyunsaturated and monounsaturated fats. ? Foods with whole grains, such as whole wheat, cracked wheat, brown rice, and wild rice. ? Whole grains that are fortified with folic acid. This is recommended for women who are pregnant or who want to become pregnant.  Read labels and avoid the following: ? Foods with a lot of added sugars. These include foods that contain brown sugar, corn sweetener, corn syrup, dextrose, fructose, glucose, high-fructose corn syrup, honey, invert sugar, lactose, malt syrup, maltose, molasses, raw sugar, sucrose, trehalose, or turbinado sugar.  Do not eat more than the following amounts of added sugar per day:  6 teaspoons (25 g) for women.  9 teaspoons (38 g) for men. ? Foods that contain processed or refined starches and grains. ? Refined grain products, such as white flour, degermed cornmeal, white bread, and white rice. Shopping  Choose nutrient-rich snacks, such as vegetables, whole fruits, and nuts. Avoid high-calorie and high-sugar snacks, such as potato chips, fruit snacks, and candy.  Use oil-based dressings and spreads on foods instead of  solid fats such as butter, stick margarine, or cream cheese.  Limit pre-made sauces, mixes, and "instant" products such as flavored rice, instant noodles, and ready-made pasta.  Try more plant-protein sources, such as tofu, tempeh, black beans, edamame, lentils, nuts, and seeds.  Explore eating plans such as the Mediterranean diet or vegetarian diet. Cooking  Use oil to saut or stir-fry foods instead of solid fats such as butter, stick margarine, or lard.  Try baking, boiling, grilling, or broiling instead of frying.  Remove the fatty part of meats before cooking.  Steam vegetables in water or broth. Meal planning   At meals, imagine dividing your plate into fourths: ? One-half of your plate is fruits and vegetables. ? One-fourth of your plate is whole grains. ? One-fourth of your plate is protein, especially lean meats, poultry, eggs, tofu, beans, or nuts.  Include low-fat dairy as part of your daily diet. Lifestyle  Choose healthy options in all settings, including home, work, school, restaurants, or stores.  Prepare your food safely: ? Wash your hands after handling raw meats. ? Keep food preparation surfaces clean by regularly washing with hot, soapy water. ? Keep raw meats separate from ready-to-eat foods, such as fruits and vegetables. ? Cook seafood, meat, poultry, and eggs to the recommended internal temperature. ? Store foods at safe temperatures. In general:  Keep cold foods at 24F (4.4C) or below.  Keep hot foods at 124F (60C) or above.  Keep your freezer at [facility] (-17.8C) or below.  Foods are no longer safe to eat when they have been between the temperatures of 40-124F (4.4-60C)  for more than 2 hours. What foods should I eat? Fruits Aim to eat 2 cup-equivalents of fresh, canned (in natural juice), or frozen fruits each day. Examples of 1 cup-equivalent of fruit include 1 small apple, 8 large strawberries, 1 cup canned fruit,  cup dried fruit, or 1 cup  100% juice. Vegetables Aim to eat 2-3 cup-equivalents of fresh and frozen vegetables each day, including different varieties and colors. Examples of 1 cup-equivalent of vegetables include 2 medium carrots, 2 cups raw, leafy greens, 1 cup chopped vegetable (raw or cooked), or 1 medium baked potato. Grains Aim to eat 6 ounce-equivalents of whole grains each day. Examples of 1 ounce-equivalent of grains include 1 slice of bread, 1 cup ready-to-eat cereal, 3 cups popcorn, or  cup cooked rice, pasta, or cereal. Meats and other proteins Aim to eat 5-6 ounce-equivalents of protein each day. Examples of 1 ounce-equivalent of protein include 1 egg, 1/2 cup nuts or seeds, or 1 tablespoon (16 g) peanut butter. A cut of meat or fish that is the size of a deck of cards is about 3-4 ounce-equivalents.  Of the protein you eat each week, try to have at least 8 ounces come from seafood. This includes salmon, trout, herring, and anchovies. Dairy Aim to eat 3 cup-equivalents of fat-free or low-fat dairy each day. Examples of 1 cup-equivalent of dairy include 1 cup (240 mL) milk, 8 ounces (250 g) yogurt, 1 ounces (44 g) natural cheese, or 1 cup (240 mL) fortified soy milk. Fats and oils  Aim for about 5 teaspoons (21 g) per day. Choose monounsaturated fats, such as canola and olive oils, avocados, peanut butter, and most nuts, or polyunsaturated fats, such as sunflower, corn, and soybean oils, walnuts, pine nuts, sesame seeds, sunflower seeds, and flaxseed. Beverages  Aim for six 8-oz glasses of water per day. Limit coffee to three to five 8-oz cups per day.  Limit caffeinated beverages that have added calories, such as soda and energy drinks.  Limit alcohol intake to no more than 1 drink a day for nonpregnant women and 2 drinks a day for men. One drink equals 12 oz of beer (355 mL), 5 oz of wine (148 mL), or 1 oz of hard liquor (44 mL). Seasoning and other foods  Avoid adding excess amounts of salt to your  foods. Try flavoring foods with herbs and spices instead of salt.  Avoid adding sugar to foods.  Try using oil-based dressings, sauces, and spreads instead of solid fats. This information is based on general [place] nutrition guidelines. For more information, visit choosemyplate.gov. Exact amounts may vary based on your nutrition needs. Summary  A healthy eating plan may help you to maintain a healthy weight, reduce the risk of chronic diseases, and stay active throughout your life.  Plan your meals. Make sure you eat the right portions of a variety of nutrient-rich foods.  Try baking, boiling, grilling, or broiling instead of frying.  Choose healthy options in all settings, including home, work, school, restaurants, or stores. This information is not intended to replace advice given to you by your health care provider. Make sure you discuss any questions you have with your health care provider. Document Released: 11/17/2017 Document Revised: 11/17/2017 Document Reviewed: 11/17/2017 Elsevier Interactive Patient Education  2019 Elsevier Inc.  

## 2018-11-19 NOTE — Telephone Encounter (Signed)
Dr. Devona Konig is calling she is wanted to up her dose from 20 mg to 40 mg of her lexapro just until this is over.   She just filled her 20 mg she has started to take two a day and says it is helping and would like another prescription since she is going to run out faster.

## 2018-12-08 ENCOUNTER — Encounter: Payer: Self-pay | Admitting: Family Medicine

## 2018-12-14 MED ORDER — CITALOPRAM HYDROBROMIDE 40 MG PO TABS: 40.0000 mg | ORAL_TABLET | Freq: Every day | ORAL | 3 refills | Status: DC

## 2018-12-14 NOTE — Addendum Note (Signed)
Addended by: Delia Chimes A on: 12/14/2018 01:31 AM   Modules accepted: Orders

## 2018-12-14 NOTE — Telephone Encounter (Signed)
Patient was advised and voiced understanding 

## 2018-12-14 NOTE — Telephone Encounter (Signed)
Please notify the patient that the maximum dose of lexapro is 20mg . I have sent in the higher dose alternative Celexa which is in the same family. The dose is 40mg  and they are relatively similar medications. She should switch to the new medication.

## 2018-12-23 ENCOUNTER — Telehealth: Payer: Self-pay | Admitting: Family Medicine

## 2018-12-30 DIAGNOSIS — F4320 Adjustment disorder, unspecified: Secondary | ICD-10-CM | POA: Diagnosis not present

## 2019-01-26 NOTE — Telephone Encounter (Signed)
Scheduled

## 2019-02-25 ENCOUNTER — Encounter: Payer: Self-pay | Admitting: Family Medicine

## 2019-04-28 ENCOUNTER — Other Ambulatory Visit: Payer: Self-pay | Admitting: Family Medicine

## 2019-06-02 DIAGNOSIS — F4320 Adjustment disorder, unspecified: Secondary | ICD-10-CM | POA: Diagnosis not present

## 2019-06-09 ENCOUNTER — Telehealth: Payer: Self-pay | Admitting: Family Medicine

## 2019-06-09 NOTE — Telephone Encounter (Signed)
Copied from [place] [id number]. Topic: General - Call Back - No Documentation >> Jun 09, 2019  3:51 PM [name] wrote: [phone] Call back request, questions for clarification. Please advise >> Jun 09, 2019  4:55 PM [name] wrote: Confirmed with [facility]EC agent that the questions the patient has are clinical.

## 2019-06-23 ENCOUNTER — Telehealth: Payer: Self-pay | Admitting: Family Medicine

## 2019-06-23 NOTE — Telephone Encounter (Signed)
Spoke with pt about concerns, she verbalized understanding. 

## 2019-06-23 NOTE — Telephone Encounter (Signed)
LVM to call office back about questions/comcerns.

## 2019-07-02 ENCOUNTER — Other Ambulatory Visit: Payer: Self-pay

## 2019-07-02 ENCOUNTER — Ambulatory Visit (INDEPENDENT_AMBULATORY_CARE_PROVIDER_SITE_OTHER): Payer: PPO | Admitting: Family Medicine

## 2019-07-02 ENCOUNTER — Encounter: Payer: Self-pay | Admitting: Family Medicine

## 2019-07-02 VITALS — BP 144/78 | HR 86 | Temp 98.4°F | Ht 66.0 in | Wt 177.2 lb

## 2019-07-02 DIAGNOSIS — E559 Vitamin D deficiency, unspecified: Secondary | ICD-10-CM | POA: Diagnosis not present

## 2019-07-02 DIAGNOSIS — M5136 Other intervertebral disc degeneration, lumbar region: Secondary | ICD-10-CM | POA: Diagnosis not present

## 2019-07-02 DIAGNOSIS — R7989 Other specified abnormal findings of blood chemistry: Secondary | ICD-10-CM

## 2019-07-02 DIAGNOSIS — E2839 Other primary ovarian failure: Secondary | ICD-10-CM

## 2019-07-02 DIAGNOSIS — M810 Age-related osteoporosis without current pathological fracture: Secondary | ICD-10-CM

## 2019-07-02 NOTE — Patient Instructions (Addendum)
If you have lab work done today you will be contacted with your lab results within the next 2 weeks.  If you have not heard from [place] then please contact us. The fastest way to get your results is to register for My Chart.   IF you received an x-ray today, you will receive an invoice from [facility]. Please contact [facility] at [phone] with questions or concerns regarding your invoice.   IF you received labwork today, you will receive an invoice from [facility]. Please contact [facility] at [phone] with questions or concerns regarding your invoice.   Our billing staff will not be able to assist you with questions regarding bills from these companies.  You will be contacted with the lab results as soon as they are available. The fastest way to get your results is to activate your My Chart account. Instructions are located on the last page of this paperwork. If you have not heard from [place] regarding the results in 2 weeks, please contact this office.      Osteoporosis  Osteoporosis is thinning and loss of density in your bones. Osteoporosis makes bones more brittle and fragile and more likely to break (fracture). Over time, osteoporosis can cause your bones to become so weak that they fracture after a minor fall. Bones in the hip, wrist, and spine are most likely to fracture due to osteoporosis. What are the causes? The exact cause of this condition is not known. What increases the risk? You may be at greater risk for osteoporosis if you:  Have a family history of the condition.  Have poor nutrition.  Use steroid medicines, such as prednisone.  Are female.  Are age 11 or older.  Smoke or have a history of smoking.  Are not physically active (are sedentary).  Are white (Caucasian) or of Asian descent.  Have a small body frame.  Take certain medicines, such as antiseizure medicines. What are the signs or symptoms? A fracture might be the first sign  of osteoporosis, especially if the fracture results from a fall or injury that usually would not cause a bone to break. Other signs and symptoms include:  Pain in the neck or low back.  Stooped posture.  Loss of height. How is this diagnosed? This condition may be diagnosed based on:  Your medical history.  A physical exam.  A bone mineral density test, also called a DXA or DEXA test (dual-energy X-ray absorptiometry test). This test uses X-rays to measure the amount of minerals in your bones. How is this treated? The goal of treatment is to strengthen your bones and lower your risk for a fracture. Treatment may involve:  Making lifestyle changes, such as: ? Including foods with more calcium and vitamin D in your diet. ? Doing weight-bearing and muscle-strengthening exercises. ? Stopping tobacco use. ? Limiting alcohol intake.  Taking medicine to slow the process of bone loss or to increase bone density.  Taking daily supplements of calcium and vitamin D.  Taking hormone replacement medicines, such as estrogen for women and testosterone for men.  Monitoring your levels of calcium and vitamin D. Follow these instructions at home:  Activity  Exercise as told by your health care provider. Ask your health care provider what exercises and activities are safe for you. You should do: ? Exercises that make you work against gravity (weight-bearing exercises), such as tai chi, yoga, or walking. ? Exercises to strengthen muscles, such as lifting weights. Lifestyle  Limit  alcohol intake to no more than 1 drink a day for nonpregnant women and 2 drinks a day for men. One drink equals 12 oz of beer, 5 oz of wine, or 1 oz of hard liquor.  Do not use any products that contain nicotine or tobacco, such as cigarettes and e-cigarettes. If you need help quitting, ask your health care provider. Preventing falls  Use devices to help you move around (mobility aids) as needed, such as canes,  walkers, scooters, or crutches.  Keep rooms well-lit and clutter-free.  Remove tripping hazards from walkways, including cords and throw rugs.  Install grab bars in bathrooms and safety rails on stairs.  Use rubber mats in the bathroom and other areas that are often wet or slippery.  Wear closed-toe shoes that fit well and support your feet. Wear shoes that have rubber soles or low heels.  Review your medicines with your health care provider. Some medicines can cause dizziness or changes in blood pressure, which can increase your risk of falling. General instructions  Include calcium and vitamin D in your diet. Calcium is important for bone health, and vitamin D helps your body to absorb calcium. Good sources of calcium and vitamin D include: ? Certain fatty fish, such as salmon and tuna. ? Products that have calcium and vitamin D added to them (fortified products), such as fortified cereals. ? Egg yolks. ? Cheese. ? Liver.  Take over-the-counter and prescription medicines only as told by your health care provider.  Keep all follow-up visits as told by your health care provider. This is important. Contact a health care provider if:  You have never been screened for osteoporosis and you are: ? A woman who is age 54 or older. ? A man who is age 5 or older. Get help right away if:  You fall or injure yourself. Summary  Osteoporosis is thinning and loss of density in your bones. This makes bones more brittle and fragile and more likely to break (fracture),even with minor falls.  The goal of treatment is to strengthen your bones and reduce your risk for a fracture.  Include calcium and vitamin D in your diet. Calcium is important for bone health, and vitamin D helps your body to absorb calcium.  Talk with your health care provider about screening for osteoporosis if you are a woman who is age 26 or older, or a man who is age 63 or older. This information is not intended to  replace advice given to you by your health care provider. Make sure you discuss any questions you have with your health care provider. Document Released: 05/15/2005 Document Revised: 07/18/2017 Document Reviewed: 05/30/2017 Elsevier Patient Education  2020 [employer].
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
## 2019-07-03 LAB — CBC
Hematocrit: 38.7 % (ref 34.0–46.6)
Hemoglobin: 13.5 g/dL (ref 11.1–15.9)
MCH: 33.3 pg — ABNORMAL HIGH (ref 26.6–33.0)
MCHC: 34.9 g/dL (ref 31.5–35.7)
MCV: 95 fL (ref 79–97)
Platelets: 427 10*3/uL (ref 150–450)
RBC: 4.06 x10E6/uL (ref 3.77–5.28)
RDW: 12.3 % (ref 11.7–15.4)
WBC: 7.4 10*3/uL (ref 3.4–10.8)

## 2019-07-03 LAB — VITAMIN D 25 HYDROXY (VIT D DEFICIENCY, FRACTURES): Vit D, 25-Hydroxy: 33.3 ng/mL (ref 30.0–100.0)

## 2019-07-03 LAB — TSH: TSH: 8.73 u[IU]/mL — ABNORMAL HIGH (ref 0.450–4.500)

## 2019-07-08 ENCOUNTER — Encounter: Payer: Self-pay | Admitting: Family Medicine

## 2019-07-08 ENCOUNTER — Ambulatory Visit: Payer: PPO | Admitting: Family Medicine

## 2019-07-09 ENCOUNTER — Other Ambulatory Visit: Payer: Self-pay | Admitting: Family Medicine

## 2019-07-09 DIAGNOSIS — Z1231 Encounter for screening mammogram for malignant neoplasm of breast: Secondary | ICD-10-CM

## 2019-07-13 ENCOUNTER — Other Ambulatory Visit: Payer: Self-pay

## 2019-07-13 DIAGNOSIS — Z20822 Contact with and (suspected) exposure to covid-19: Secondary | ICD-10-CM

## 2019-07-15 LAB — NOVEL CORONAVIRUS, NAA: SARS-CoV-2, NAA: NOT DETECTED

## 2019-07-21 ENCOUNTER — Other Ambulatory Visit: Payer: Self-pay

## 2019-07-21 ENCOUNTER — Telehealth: Payer: Self-pay

## 2019-07-21 ENCOUNTER — Telehealth (INDEPENDENT_AMBULATORY_CARE_PROVIDER_SITE_OTHER): Payer: PPO | Admitting: Family Medicine

## 2019-07-21 NOTE — Progress Notes (Signed)
Cc: discuss thyroid results.  Pt c/o not sleeping and want to discuss ? Ambien.  Needs refill on vyvanse aand pravastatin. No travel outside [place] or [place] in the last 3 weeks.

## 2019-07-21 NOTE — Telephone Encounter (Signed)
lvm to return office call(calling to start telemed visit) Dgaddy, CMA

## 2019-08-02 ENCOUNTER — Other Ambulatory Visit: Payer: Self-pay

## 2019-08-02 ENCOUNTER — Telehealth (INDEPENDENT_AMBULATORY_CARE_PROVIDER_SITE_OTHER): Payer: PPO | Admitting: Family Medicine

## 2019-08-02 DIAGNOSIS — E039 Hypothyroidism, unspecified: Secondary | ICD-10-CM

## 2019-08-02 DIAGNOSIS — M810 Age-related osteoporosis without current pathological fracture: Secondary | ICD-10-CM

## 2019-08-02 MED ORDER — LEVOTHYROXINE SODIUM 50 MCG PO TABS: ORAL_TABLET | ORAL | 0 refills | Status: DC
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
## 2019-08-02 NOTE — Progress Notes (Signed)
CC: discuss thyroid.  Pt has concerns about if she is taking the right amount of vitamin D.  No travel outside the [place] or [place] in the past 3 weeks.  Needs refill on lexapro, fosamax and pravastatin.

## 2019-08-02 NOTE — Patient Instructions (Signed)
° ° ° °  If you have lab work done today you will be contacted with your lab results within the next 2 weeks.  If you have not heard from us then please contact us. The fastest way to get your results is to register for My Chart. ° ° °IF you received an x-ray today, you will receive an invoice from [facility]. Please contact [facility] at [phone] with questions or concerns regarding your invoice.  ° °IF you received labwork today, you will receive an invoice from [facility]. Please contact [facility] at [phone] with questions or concerns regarding your invoice.  ° °Our billing staff will not be able to assist you with questions regarding bills from these companies. ° °You will be contacted with the lab results as soon as they are available. The fastest way to get your results is to activate your My Chart account. Instructions are located on the last page of this paperwork. If you have not heard from us regarding the results in 2 weeks, please contact this office. °  ° ° ° °

## 2019-09-03 ENCOUNTER — Other Ambulatory Visit: Payer: Self-pay | Admitting: Family Medicine

## 2019-09-03 NOTE — Telephone Encounter (Signed)
Requested medication (s) are due for refill today:   Not sure she is still on this  Requested medication (s) are on the active medication list:   Not sure  Future visit scheduled:   No   Last ordered: 04/14/2018  #90  3 refills   Doesn't look she has been on this for several months.   Requested Prescriptions  Pending Prescriptions Disp Refills   pravastatin (PRAVACHOL) 20 MG tablet [Pharmacy Med Name: pravastatin 20 mg tablet] 90 tablet 3    Sig: TAKE 1 TABLET BY MOUTH EVERY DAY      Cardiovascular:  Antilipid - Statins Failed - 09/03/2019 10:03 AM      Failed - Total Cholesterol in normal range and within 360 days    Cholesterol, Total  Date Value Ref Range Status  05/12/2017 278 (H) 100 - 199 mg/dL Final          Failed - LDL in normal range and within 360 days    LDL Calculated  Date Value Ref Range Status  05/12/2017 186 (H) 0 - 99 mg/dL Final          Failed - HDL in normal range and within 360 days    HDL  Date Value Ref Range Status  05/12/2017 68 >39 mg/dL Final          Failed - Triglycerides in normal range and within 360 days    Triglycerides  Date Value Ref Range Status  05/12/2017 121 0 - 149 mg/dL Final          Passed - Patient is not pregnant      Passed - Valid encounter within last 12 months    Recent Outpatient Visits           1 month ago Age-related osteoporosis without current pathological fracture   [facility] at [facility], Arlie Solomons, MD   1 month ago    Primary Care at [facility], Arlie Solomons, MD   2 months ago Degenerative disc disease, lumbar   Primary Care at [facility], Arlie Solomons, MD   9 months ago Medicare annual wellness visit, subsequent   Primary Care at [place], [place]   10 months ago Screening for endocrine, nutritional, metabolic and immunity disorder   Primary Care at [facility], Arlie Solomons, MD

## 2019-09-09 ENCOUNTER — Other Ambulatory Visit: Payer: PPO

## 2019-10-06 ENCOUNTER — Ambulatory Visit: Payer: PPO

## 2019-10-06 ENCOUNTER — Other Ambulatory Visit: Payer: PPO

## 2019-10-26 ENCOUNTER — Encounter: Payer: Self-pay | Admitting: Family Medicine

## 2019-11-07 NOTE — Progress Notes (Signed)
Was not able to reach patient

## 2019-11-24 ENCOUNTER — Encounter: Payer: Self-pay | Admitting: *Deleted

## 2019-11-24 ENCOUNTER — Ambulatory Visit (INDEPENDENT_AMBULATORY_CARE_PROVIDER_SITE_OTHER): Payer: PPO | Admitting: Family Medicine

## 2019-11-24 ENCOUNTER — Other Ambulatory Visit: Payer: Self-pay | Admitting: *Deleted

## 2019-11-24 VITALS — BP 144/78 | Ht 66.0 in | Wt 177.0 lb

## 2019-11-24 DIAGNOSIS — Z0000 Encounter for general adult medical examination without abnormal findings: Secondary | ICD-10-CM | POA: Diagnosis not present

## 2019-11-24 MED ORDER — PRAVASTATIN SODIUM 20 MG PO TABS: 20.0000 mg | ORAL_TABLET | Freq: Every day | ORAL | 0 refills | Status: DC

## 2019-11-24 MED ORDER — LEVOTHYROXINE SODIUM 50 MCG PO TABS: ORAL_TABLET | ORAL | 0 refills | Status: DC
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
## 2019-11-24 NOTE — Patient Instructions (Signed)
Thank you for taking time to come for your Medicare Wellness Visit. I appreciate your ongoing commitment to your health goals. Please review the following plan we discussed and let me know if I can assist you in the future.  Leroy Kennedy LPN  Preventive Care 69 Years and Older, Female Preventive care refers to lifestyle choices and visits with your health care provider that can promote health and wellness. This includes:  A yearly physical exam. This is also called an annual well check.  Regular dental and eye exams.  Immunizations.  Screening for certain conditions.  Healthy lifestyle choices, such as diet and exercise. What can I expect for my preventive care visit? Physical exam Your health care provider will check:  Height and weight. These may be used to calculate body mass index (BMI), which is a measurement that tells if you are at a healthy weight.  Heart rate and blood pressure.  Your skin for abnormal spots. Counseling Your health care provider may ask you questions about:  Alcohol, tobacco, and drug use.  Emotional well-being.  Home and relationship well-being.  Sexual activity.  Eating habits.  History of falls.  Memory and ability to understand (cognition).  Work and work Statistician.  Pregnancy and menstrual history. What immunizations do I need?  Influenza (flu) vaccine  This is recommended every year. Tetanus, diphtheria, and pertussis (Tdap) vaccine  You may need a Td booster every 10 years. Varicella (chickenpox) vaccine  You may need this vaccine if you have not already been vaccinated. Zoster (shingles) vaccine  You may need this after age 19. Pneumococcal conjugate (PCV13) vaccine  One dose is recommended after age 16. Pneumococcal polysaccharide (PPSV23) vaccine  One dose is recommended after age 50. Measles, mumps, and rubella (MMR) vaccine  You may need at least one dose of MMR if you were born in 1957 or later. You may also  need a second dose. Meningococcal conjugate (MenACWY) vaccine  You may need this if you have certain conditions. Hepatitis A vaccine  You may need this if you have certain conditions or if you travel or work in places where you may be exposed to hepatitis A. Hepatitis B vaccine  You may need this if you have certain conditions or if you travel or work in places where you may be exposed to hepatitis B. Haemophilus influenzae type b (Hib) vaccine  You may need this if you have certain conditions. You may receive vaccines as individual doses or as more than one vaccine together in one shot (combination vaccines). Talk with your health care provider about the risks and benefits of combination vaccines. What tests do I need? Blood tests  Lipid and cholesterol levels. These may be checked every 5 years, or more frequently depending on your overall health.  Hepatitis C test.  Hepatitis B test. Screening  Lung cancer screening. You may have this screening every year starting at age 69 if you have a 30-pack-year history of smoking and currently smoke or have quit within the past 15 years.  Colorectal cancer screening. All adults should have this screening starting at age 69 and continuing until age 69. Your health care provider may recommend screening at age 7 if you are at increased risk. You will have tests every 1-10 years, depending on your results and the type of screening test.  Diabetes screening. This is done by checking your blood sugar (glucose) after you have not eaten for a while (fasting). You may have this done every 1-3  years.  Mammogram. This may be done every 1-2 years. Talk with your health care provider about how often you should have regular mammograms.  BRCA-related cancer screening. This may be done if you have a family history of breast, ovarian, tubal, or peritoneal cancers. Other tests  Sexually transmitted disease (STD) testing.  Bone density scan. This is done  to screen for osteoporosis. You may have this done starting at age 69. Follow these instructions at home: Eating and drinking  Eat a diet that includes fresh fruits and vegetables, whole grains, lean protein, and low-fat dairy products. Limit your intake of foods with high amounts of sugar, saturated fats, and salt.  Take vitamin and mineral supplements as recommended by your health care provider.  Do not drink alcohol if your health care provider tells you not to drink.  If you drink alcohol: ? Limit how much you have to 0-1 drink a day. ? Be aware of how much alcohol is in your drink. In the [place] one drink equals one 12 oz bottle of beer (355 mL), one 5 oz glass of wine (148 mL), or one 1 oz glass of hard liquor (44 mL). Lifestyle  Take daily care of your teeth and gums.  Stay active. Exercise for at least 30 minutes on 5 or more days each week.  Do not use any products that contain nicotine or tobacco, such as cigarettes, e-cigarettes, and chewing tobacco. If you need help quitting, ask your health care provider.  If you are sexually active, practice safe sex. Use a condom or other form of protection in order to prevent STIs (sexually transmitted infections).  Talk with your health care provider about taking a low-dose aspirin or statin. What's next?  Go to your health care provider once a year for a well check visit.  Ask your health care provider how often you should have your eyes and teeth checked.  Stay up to date on all vaccines. This information is not intended to replace advice given to you by your health care provider. Make sure you discuss any questions you have with your health care provider. Document Revised: 07/30/2018 Document Reviewed: 07/30/2018 Elsevier Patient Education  2020 [employer].

## 2019-11-30 NOTE — Telephone Encounter (Signed)
l °

## 2019-12-22 ENCOUNTER — Other Ambulatory Visit: Payer: Self-pay

## 2019-12-22 ENCOUNTER — Ambulatory Visit
Admission: RE | Admit: 2019-12-22 | Discharge: 2019-12-22 | Disposition: A | Discharge status: Home / Self Care | Payer: PPO | Source: Ambulatory Visit | Attending: Family Medicine | Admitting: Family Medicine

## 2019-12-22 DIAGNOSIS — Z780 Asymptomatic menopausal state: Secondary | ICD-10-CM | POA: Diagnosis not present

## 2019-12-22 DIAGNOSIS — Z1231 Encounter for screening mammogram for malignant neoplasm of breast: Secondary | ICD-10-CM

## 2019-12-22 DIAGNOSIS — M8589 Other specified disorders of bone density and structure, multiple sites: Secondary | ICD-10-CM | POA: Diagnosis not present

## 2019-12-22 DIAGNOSIS — M5136 Other intervertebral disc degeneration, lumbar region: Secondary | ICD-10-CM

## 2019-12-22 DIAGNOSIS — E2839 Other primary ovarian failure: Secondary | ICD-10-CM

## 2019-12-22 DIAGNOSIS — M810 Age-related osteoporosis without current pathological fracture: Secondary | ICD-10-CM

## 2019-12-23 ENCOUNTER — Other Ambulatory Visit: Payer: Self-pay | Admitting: Family Medicine

## 2019-12-23 DIAGNOSIS — R928 Other abnormal and inconclusive findings on diagnostic imaging of breast: Secondary | ICD-10-CM

## 2019-12-24 ENCOUNTER — Ambulatory Visit
Admission: RE | Admit: 2019-12-24 | Discharge: 2019-12-24 | Disposition: A | Discharge status: Home / Self Care | Payer: PPO | Source: Ambulatory Visit | Attending: Family Medicine | Admitting: Family Medicine

## 2019-12-24 ENCOUNTER — Other Ambulatory Visit: Payer: Self-pay | Admitting: Family Medicine

## 2019-12-24 ENCOUNTER — Other Ambulatory Visit: Payer: Self-pay

## 2019-12-24 DIAGNOSIS — N6489 Other specified disorders of breast: Secondary | ICD-10-CM | POA: Diagnosis not present

## 2019-12-24 DIAGNOSIS — R928 Other abnormal and inconclusive findings on diagnostic imaging of breast: Secondary | ICD-10-CM

## 2020-01-07 ENCOUNTER — Ambulatory Visit
Admission: RE | Admit: 2020-01-07 | Discharge: 2020-01-07 | Disposition: A | Discharge status: Home / Self Care | Payer: PPO | Source: Ambulatory Visit | Attending: Family Medicine | Admitting: Family Medicine

## 2020-01-07 ENCOUNTER — Other Ambulatory Visit: Payer: Self-pay

## 2020-01-07 DIAGNOSIS — R928 Other abnormal and inconclusive findings on diagnostic imaging of breast: Secondary | ICD-10-CM

## 2020-01-07 DIAGNOSIS — N6011 Diffuse cystic mastopathy of right breast: Secondary | ICD-10-CM | POA: Diagnosis not present

## 2020-01-07 DIAGNOSIS — N6313 Unspecified lump in the right breast, lower outer quadrant: Secondary | ICD-10-CM | POA: Diagnosis not present

## 2020-01-27 ENCOUNTER — Other Ambulatory Visit: Payer: Self-pay | Admitting: Family Medicine

## 2020-01-27 MED ORDER — ALENDRONATE SODIUM 35 MG PO TABS: 70.0000 mg | ORAL_TABLET | ORAL | 3 refills | Status: DC

## 2020-01-27 NOTE — Telephone Encounter (Signed)
Copied from [place] [id number]. Topic: Quick Communication - Rx Refill/Question >> Jan 27, 2020  4:35 PM Mcneil, Ja-Kwan wrote: Medication: alendronate (FOSAMAX) 35 MG tablet  Has the patient contacted their pharmacy? yes   Preferred Pharmacy (with phone number or street name): [facility] - [place], [place] - [address]  Phone: [phone]   Fax: [phone]  Agent: Please be advised that RX refills may take up to 3 business days. We ask that you follow-up with your pharmacy.

## 2020-01-27 NOTE — Telephone Encounter (Signed)
Requested medication (s) are due for refill today:yes  Requested medication (s) are on the active medication list: yes  Last refill: 10/28/18  Future visit scheduled: yes with Dr Pamella Pert  Notes to clinic:  Appointment with Dr Pamella Pert 1 month    Requested Prescriptions  Pending Prescriptions Disp Refills   alendronate (FOSAMAX) 35 MG tablet 24 tablet 3    Sig: Take 2 tablets (70 mg total) by mouth every 7 (seven) days. Take with a full glass of water on an empty stomach.      Endocrinology:  Bisphosphonates Failed - 01/27/2020  4:44 PM      Failed - Ca in normal range and within 360 days    Calcium  Date Value Ref Range Status  10/28/2018 9.8 8.7 - 10.3 mg/dL Final          Passed - Vitamin D in normal range and within 360 days    Vit D, 25-Hydroxy  Date Value Ref Range Status  07/02/2019 33.3 30.0 - 100.0 ng/mL Final    Comment:    Vitamin D deficiency has been defined by the Institute of Medicine and an Endocrine Society practice guideline as a level of serum 25-OH vitamin D less than 20 ng/mL (1,2). The Endocrine Society went on to further define vitamin D insufficiency as a level between 21 and 29 ng/mL (2). 1. IOM (Institute of Medicine). 2010. Dietary reference    intakes for calcium and D. [place]: The    [employer]. 2. Holick MF, Binkley [place], Bischoff-Ferrari HA, et al.    Evaluation, treatment, and prevention of vitamin D    deficiency: an Endocrine Society clinical practice    guideline. JCEM. 2011 Jul; 96(7):1911-30.           Passed - Valid encounter within last 12 months    Recent Outpatient Visits           2 months ago Medicare annual wellness visit, subsequent   Primary Care at [facility], [place] A, MD   5 months ago Age-related osteoporosis without current pathological fracture   Primary Care at [facility], Arlie Solomons, MD   6 months ago    Primary Care at [facility], Arlie Solomons, MD   6 months ago Degenerative disc  disease, lumbar   Primary Care at [facility], Arlie Solomons, MD   1 year ago Medicare annual wellness visit, subsequent   Primary Care at [facility], Arlie Solomons, MD       Future Appointments             In 1 month Pamella Pert, Lilia Argue, MD Primary Care at [place], [facility]

## 2020-02-23 ENCOUNTER — Other Ambulatory Visit: Payer: Self-pay | Admitting: Registered Nurse

## 2020-02-23 ENCOUNTER — Telehealth: Payer: Self-pay

## 2020-02-23 DIAGNOSIS — W57XXXA Bitten or stung by nonvenomous insect and other nonvenomous arthropods, initial encounter: Secondary | ICD-10-CM

## 2020-02-23 MED ORDER — DOXYCYCLINE HYCLATE 100 MG PO TABS: 100.0000 mg | ORAL_TABLET | Freq: Two times a day (BID) | ORAL | 0 refills | Status: DC

## 2020-02-23 NOTE — Telephone Encounter (Signed)
Pt called in and stated that she had a tick bite on her . She did not know how long it was on her she just noticed when she was getting out the shower and pilled it off. I informed Rich in regards to the situation and he said that he would call her in an antibiotic.

## 2020-03-21 ENCOUNTER — Encounter: Payer: Self-pay | Admitting: Family Medicine

## 2020-03-21 ENCOUNTER — Other Ambulatory Visit: Payer: Self-pay

## 2020-03-21 ENCOUNTER — Ambulatory Visit (INDEPENDENT_AMBULATORY_CARE_PROVIDER_SITE_OTHER): Payer: PPO | Admitting: Family Medicine

## 2020-03-21 VITALS — BP 124/82 | HR 96 | Temp 97.7°F | Ht 66.0 in | Wt 173.4 lb

## 2020-03-21 DIAGNOSIS — F17200 Nicotine dependence, unspecified, uncomplicated: Secondary | ICD-10-CM

## 2020-03-21 DIAGNOSIS — E039 Hypothyroidism, unspecified: Secondary | ICD-10-CM

## 2020-03-21 DIAGNOSIS — F325 Major depressive disorder, single episode, in full remission: Secondary | ICD-10-CM | POA: Diagnosis not present

## 2020-03-21 DIAGNOSIS — Z23 Encounter for immunization: Secondary | ICD-10-CM

## 2020-03-21 DIAGNOSIS — M810 Age-related osteoporosis without current pathological fracture: Secondary | ICD-10-CM

## 2020-03-21 DIAGNOSIS — E785 Hyperlipidemia, unspecified: Secondary | ICD-10-CM | POA: Diagnosis not present

## 2020-03-21 MED ORDER — LEVOTHYROXINE SODIUM 50 MCG PO TABS: ORAL_TABLET | ORAL | 3 refills | Status: DC

## 2020-03-21 MED ORDER — PRAVASTATIN SODIUM 20 MG PO TABS: 20.0000 mg | ORAL_TABLET | Freq: Every day | ORAL | 3 refills | Status: DC

## 2020-03-21 MED ORDER — ESCITALOPRAM OXALATE 20 MG PO TABS: 20.0000 mg | ORAL_TABLET | Freq: Every day | ORAL | 3 refills | Status: DC

## 2020-03-21 NOTE — Patient Instructions (Addendum)
You will be due for next colonoscopy this November 2021 due to polyps, if you do not hear from [facility] office, please let us know so that we can make a referral.       If you have lab work done today you will be contacted with your lab results within the next 2 weeks.  If you have not heard from [place] then please contact us. The fastest way to get your results is to register for My Chart.   IF you received an x-ray today, you will receive an invoice from [facility]. Please contact [facility] at [phone] with questions or concerns regarding your invoice.   IF you received labwork today, you will receive an invoice from [facility]. Please contact [facility] at [phone] with questions or concerns regarding your invoice.   Our billing staff will not be able to assist you with questions regarding bills from these companies.  You will be contacted with the lab results as soon as they are available. The fastest way to get your results is to activate your My Chart account. Instructions are located on the last page of this paperwork. If you have not heard from [place] regarding the results in 2 weeks, please contact this office.
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
## 2020-03-22 LAB — COMPREHENSIVE METABOLIC PANEL
ALT: 18 IU/L (ref 0–32)
AST: 14 IU/L (ref 0–40)
Albumin/Globulin Ratio: 2 (ref 1.2–2.2)
Albumin: 4.9 g/dL — ABNORMAL HIGH (ref 3.8–4.8)
Alkaline Phosphatase: 82 IU/L (ref 48–121)
BUN/Creatinine Ratio: 22 (ref 12–28)
BUN: 15 mg/dL (ref 8–27)
Bilirubin Total: 0.2 mg/dL (ref 0.0–1.2)
CO2: 18 mmol/L — ABNORMAL LOW (ref 20–29)
Calcium: 9.9 mg/dL (ref 8.7–10.3)
Chloride: 102 mmol/L (ref 96–106)
Creatinine, Ser: 0.68 mg/dL (ref 0.57–1.00)
GFR calc Af Amer: 103 mL/min/{1.73_m2} (ref >59)
GFR calc non Af Amer: 90 mL/min/{1.73_m2} (ref >59)
Globulin, Total: 2.5 g/dL (ref 1.5–4.5)
Glucose: 101 mg/dL — ABNORMAL HIGH (ref 65–99)
Potassium: 4.8 mmol/L (ref 3.5–5.2)
Sodium: 139 mmol/L (ref 134–144)
Total Protein: 7.4 g/dL (ref 6.0–8.5)

## 2020-03-22 LAB — LIPID PANEL
Chol/HDL Ratio: 3.5 ratio (ref 0.0–4.4)
Cholesterol, Total: 252 mg/dL — ABNORMAL HIGH (ref 100–199)
HDL: 71 mg/dL (ref >39)
LDL Chol Calc (NIH): 147 mg/dL — ABNORMAL HIGH (ref 0–99)
Triglycerides: 191 mg/dL — ABNORMAL HIGH (ref 0–149)
VLDL Cholesterol Cal: 34 mg/dL (ref 5–40)

## 2020-03-22 LAB — TSH: TSH: 4.36 u[IU]/mL (ref 0.450–4.500)

## 2020-04-18 ENCOUNTER — Other Ambulatory Visit: Payer: Self-pay | Admitting: Family Medicine

## 2020-04-18 MED ORDER — PRAVASTATIN SODIUM 40 MG PO TABS: 40.0000 mg | ORAL_TABLET | Freq: Every day | ORAL | 3 refills | Status: DC

## 2020-10-03 ENCOUNTER — Encounter: Payer: Self-pay | Admitting: Gastroenterology

## 2020-10-20 DIAGNOSIS — S40012A Contusion of left shoulder, initial encounter: Secondary | ICD-10-CM | POA: Diagnosis not present

## 2020-10-20 DIAGNOSIS — S42022A Displaced fracture of shaft of left clavicle, initial encounter for closed fracture: Secondary | ICD-10-CM | POA: Diagnosis not present

## 2020-10-20 DIAGNOSIS — W548XXA Other contact with dog, initial encounter: Secondary | ICD-10-CM | POA: Diagnosis not present

## 2020-10-24 DIAGNOSIS — G8918 Other acute postprocedural pain: Secondary | ICD-10-CM | POA: Diagnosis not present

## 2020-10-24 DIAGNOSIS — S42022A Displaced fracture of shaft of left clavicle, initial encounter for closed fracture: Secondary | ICD-10-CM | POA: Diagnosis not present

## 2020-10-25 DIAGNOSIS — S42022D Displaced fracture of shaft of left clavicle, subsequent encounter for fracture with routine healing: Secondary | ICD-10-CM | POA: Diagnosis not present

## 2020-10-31 DIAGNOSIS — R6889 Other general symptoms and signs: Secondary | ICD-10-CM | POA: Diagnosis not present

## 2020-10-31 DIAGNOSIS — S42022D Displaced fracture of shaft of left clavicle, subsequent encounter for fracture with routine healing: Secondary | ICD-10-CM | POA: Diagnosis not present

## 2020-10-31 DIAGNOSIS — M25612 Stiffness of left shoulder, not elsewhere classified: Secondary | ICD-10-CM | POA: Diagnosis not present

## 2020-11-07 DIAGNOSIS — Z9889 Other specified postprocedural states: Secondary | ICD-10-CM | POA: Diagnosis not present

## 2020-11-07 DIAGNOSIS — S42022D Displaced fracture of shaft of left clavicle, subsequent encounter for fracture with routine healing: Secondary | ICD-10-CM | POA: Diagnosis not present

## 2020-11-09 DIAGNOSIS — M25612 Stiffness of left shoulder, not elsewhere classified: Secondary | ICD-10-CM | POA: Diagnosis not present

## 2020-11-09 DIAGNOSIS — R6889 Other general symptoms and signs: Secondary | ICD-10-CM | POA: Diagnosis not present

## 2020-11-09 DIAGNOSIS — S42022D Displaced fracture of shaft of left clavicle, subsequent encounter for fracture with routine healing: Secondary | ICD-10-CM | POA: Diagnosis not present

## 2020-11-13 DIAGNOSIS — S42022D Displaced fracture of shaft of left clavicle, subsequent encounter for fracture with routine healing: Secondary | ICD-10-CM | POA: Diagnosis not present

## 2020-11-21 DIAGNOSIS — S42022A Displaced fracture of shaft of left clavicle, initial encounter for closed fracture: Secondary | ICD-10-CM | POA: Diagnosis not present

## 2020-11-21 DIAGNOSIS — S42022K Displaced fracture of shaft of left clavicle, subsequent encounter for fracture with nonunion: Secondary | ICD-10-CM | POA: Diagnosis not present

## 2020-11-21 DIAGNOSIS — S42022D Displaced fracture of shaft of left clavicle, subsequent encounter for fracture with routine healing: Secondary | ICD-10-CM | POA: Diagnosis not present

## 2020-11-21 DIAGNOSIS — T84428A Displacement of other internal orthopedic devices, implants and grafts, initial encounter: Secondary | ICD-10-CM | POA: Diagnosis not present

## 2020-11-21 DIAGNOSIS — T84218A Breakdown (mechanical) of internal fixation device of other bones, initial encounter: Secondary | ICD-10-CM | POA: Diagnosis not present

## 2020-11-21 DIAGNOSIS — G8918 Other acute postprocedural pain: Secondary | ICD-10-CM | POA: Diagnosis not present

## 2020-12-04 DIAGNOSIS — S42022D Displaced fracture of shaft of left clavicle, subsequent encounter for fracture with routine healing: Secondary | ICD-10-CM | POA: Diagnosis not present

## 2021-01-01 DIAGNOSIS — S42022D Displaced fracture of shaft of left clavicle, subsequent encounter for fracture with routine healing: Secondary | ICD-10-CM | POA: Diagnosis not present

## 2021-01-05 DIAGNOSIS — M25612 Stiffness of left shoulder, not elsewhere classified: Secondary | ICD-10-CM | POA: Diagnosis not present

## 2021-01-05 DIAGNOSIS — S42022D Displaced fracture of shaft of left clavicle, subsequent encounter for fracture with routine healing: Secondary | ICD-10-CM | POA: Diagnosis not present

## 2021-01-05 DIAGNOSIS — R6889 Other general symptoms and signs: Secondary | ICD-10-CM | POA: Diagnosis not present

## 2021-01-19 DIAGNOSIS — M25612 Stiffness of left shoulder, not elsewhere classified: Secondary | ICD-10-CM | POA: Diagnosis not present

## 2021-01-19 DIAGNOSIS — S42022D Displaced fracture of shaft of left clavicle, subsequent encounter for fracture with routine healing: Secondary | ICD-10-CM | POA: Diagnosis not present

## 2021-01-19 DIAGNOSIS — R6889 Other general symptoms and signs: Secondary | ICD-10-CM | POA: Diagnosis not present

## 2021-02-02 DIAGNOSIS — M25612 Stiffness of left shoulder, not elsewhere classified: Secondary | ICD-10-CM | POA: Diagnosis not present

## 2021-02-02 DIAGNOSIS — R6889 Other general symptoms and signs: Secondary | ICD-10-CM | POA: Diagnosis not present

## 2021-02-02 DIAGNOSIS — S42022D Displaced fracture of shaft of left clavicle, subsequent encounter for fracture with routine healing: Secondary | ICD-10-CM | POA: Diagnosis not present

## 2021-02-12 DIAGNOSIS — S42022D Displaced fracture of shaft of left clavicle, subsequent encounter for fracture with routine healing: Secondary | ICD-10-CM | POA: Diagnosis not present

## 2021-03-26 ENCOUNTER — Other Ambulatory Visit: Payer: Self-pay | Admitting: Nurse Practitioner

## 2021-03-26 DIAGNOSIS — F325 Major depressive disorder, single episode, in full remission: Secondary | ICD-10-CM

## 2021-03-28 ENCOUNTER — Other Ambulatory Visit: Payer: Self-pay

## 2021-03-28 ENCOUNTER — Encounter: Payer: Self-pay | Admitting: Family Medicine

## 2021-03-28 ENCOUNTER — Ambulatory Visit (INDEPENDENT_AMBULATORY_CARE_PROVIDER_SITE_OTHER): Payer: PPO | Admitting: Family Medicine

## 2021-03-28 VITALS — BP 140/90 | HR 90 | Temp 97.2°F | Ht 65.0 in | Wt 181.0 lb

## 2021-03-28 DIAGNOSIS — F325 Major depressive disorder, single episode, in full remission: Secondary | ICD-10-CM

## 2021-03-28 DIAGNOSIS — E782 Mixed hyperlipidemia: Secondary | ICD-10-CM | POA: Diagnosis not present

## 2021-03-28 DIAGNOSIS — Z8639 Personal history of other endocrine, nutritional and metabolic disease: Secondary | ICD-10-CM | POA: Insufficient documentation

## 2021-03-28 DIAGNOSIS — Z131 Encounter for screening for diabetes mellitus: Secondary | ICD-10-CM

## 2021-03-28 DIAGNOSIS — E039 Hypothyroidism, unspecified: Secondary | ICD-10-CM

## 2021-03-28 DIAGNOSIS — Z860100 Personal history of colon polyps, unspecified: Secondary | ICD-10-CM

## 2021-03-28 DIAGNOSIS — Z716 Tobacco abuse counseling: Secondary | ICD-10-CM | POA: Diagnosis not present

## 2021-03-28 DIAGNOSIS — E559 Vitamin D deficiency, unspecified: Secondary | ICD-10-CM

## 2021-03-28 DIAGNOSIS — E785 Hyperlipidemia, unspecified: Secondary | ICD-10-CM | POA: Insufficient documentation

## 2021-03-28 LAB — LIPID PANEL
Cholesterol: 252 mg/dL — ABNORMAL HIGH (ref 0–200)
HDL: 68.4 mg/dL (ref >39.00)
LDL Cholesterol: 153 mg/dL — ABNORMAL HIGH (ref 0–99)
NonHDL: 183.64
Total CHOL/HDL Ratio: 4
Triglycerides: 154 mg/dL — ABNORMAL HIGH (ref 0.0–149.0)
VLDL: 30.8 mg/dL (ref 0.0–40.0)

## 2021-03-28 LAB — VITAMIN D 25 HYDROXY (VIT D DEFICIENCY, FRACTURES): VITD: 42.23 ng/mL (ref 30.00–100.00)

## 2021-03-28 LAB — HEMOGLOBIN A1C: Hgb A1c MFr Bld: 5.6 % (ref 4.6–6.5)

## 2021-03-28 LAB — T4, FREE: Free T4: 0.52 ng/dL — ABNORMAL LOW (ref 0.60–1.60)

## 2021-03-28 LAB — TSH: TSH: 7.8 u[IU]/mL — ABNORMAL HIGH (ref 0.35–5.50)

## 2021-03-28 MED ORDER — LEVOTHYROXINE SODIUM 50 MCG PO TABS: ORAL_TABLET | ORAL | 3 refills | Status: DC

## 2021-03-28 NOTE — Addendum Note (Signed)
Addended by: [name] on: 03/28/2021 06:13 PM   Modules accepted: Orders
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
## 2021-03-29 ENCOUNTER — Encounter: Payer: Self-pay | Admitting: Family Medicine

## 2021-03-29 DIAGNOSIS — E782 Mixed hyperlipidemia: Secondary | ICD-10-CM

## 2021-03-29 MED ORDER — ATORVASTATIN CALCIUM 40 MG PO TABS: 40.0000 mg | ORAL_TABLET | Freq: Every day | ORAL | 3 refills | Status: DC

## 2021-03-29 NOTE — Telephone Encounter (Signed)
Please review and advise. Thanks. Dm/cma  

## 2021-04-17 ENCOUNTER — Telehealth: Payer: Self-pay | Admitting: Family Medicine

## 2021-04-17 NOTE — Telephone Encounter (Signed)
Left message for patient to call back and schedule Medicare Annual Wellness Visit (AWV).   Please offer to do virtually or by telephone.  Left office number and my jabber [phone].  Last AWV:11/24/2019  Please schedule at anytime with Nurse Health Advisor.

## 2021-04-20 ENCOUNTER — Other Ambulatory Visit: Payer: Self-pay | Admitting: Family Medicine

## 2021-04-20 DIAGNOSIS — F325 Major depressive disorder, single episode, in full remission: Secondary | ICD-10-CM

## 2021-04-30 ENCOUNTER — Encounter: Payer: Self-pay | Admitting: Family Medicine

## 2021-04-30 DIAGNOSIS — M810 Age-related osteoporosis without current pathological fracture: Secondary | ICD-10-CM

## 2021-04-30 MED ORDER — ALENDRONATE SODIUM 35 MG PO TABS: 70.0000 mg | ORAL_TABLET | ORAL | 3 refills | Status: DC

## 2021-05-22 ENCOUNTER — Ambulatory Visit (INDEPENDENT_AMBULATORY_CARE_PROVIDER_SITE_OTHER): Payer: PPO | Admitting: *Deleted

## 2021-05-22 DIAGNOSIS — Z0000 Encounter for general adult medical examination without abnormal findings: Secondary | ICD-10-CM | POA: Diagnosis not present
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
## 2021-05-22 NOTE — Patient Instructions (Signed)
[name] , Thank you for taking time to come for your Medicare Wellness Visit. I appreciate your ongoing commitment to your health goals. Please review the following plan we discussed and let me know if I can assist you in the future.   Screening recommendations/referrals: Colonoscopy: up to date  Mammogram: Education provided Bone Density: up to date Recommended yearly ophthalmology/optometry visit for glaucoma screening and checkup Recommended yearly dental visit for hygiene and checkup  Vaccinations: Influenza vaccine: Education provided Pneumococcal vaccine: up to date Tdap vaccine: Education provided Shingles vaccine: up to date    Advanced directives: copy requested  Conditions/risks identified:   Next appointment: 08-29-2021 @ 10:30  Dr. Gena Fray   Preventive Care 70 Years and Older, Female Preventive care refers to lifestyle choices and visits with your health care provider that can promote health and wellness. What does preventive care include? A yearly physical exam. This is also called an annual well check. Dental exams once or twice a year. Routine eye exams. Ask your health care provider how often you should have your eyes checked. Personal lifestyle choices, including: Daily care of your teeth and gums. Regular physical activity. Eating a healthy diet. Avoiding tobacco and drug use. Limiting alcohol use. Practicing safe sex. Taking low-dose aspirin every day. Taking vitamin and mineral supplements as recommended by your health care provider. What happens during an annual well check? The services and screenings done by your health care provider during your annual well check will depend on your age, overall health, lifestyle risk factors, and family history of disease. Counseling  Your health care provider may ask you questions about your: Alcohol use. Tobacco use. Drug use. Emotional well-being. Home and relationship well-being. Sexual activity. Eating  habits. History of falls. Memory and ability to understand (cognition). Work and work Statistician. Reproductive health. Screening  You may have the following tests or measurements: Height, weight, and BMI. Blood pressure. Lipid and cholesterol levels. These may be checked every 5 years, or more frequently if you are over 24 years old. Skin check. Lung cancer screening. You may have this screening every year starting at age 21 if you have a 30-pack-year history of smoking and currently smoke or have quit within the past 15 years. Fecal occult blood test (FOBT) of the stool. You may have this test every year starting at age 57. Flexible sigmoidoscopy or colonoscopy. You may have a sigmoidoscopy every 5 years or a colonoscopy every 10 years starting at age 62. Hepatitis C blood test. Hepatitis B blood test. Sexually transmitted disease (STD) testing. Diabetes screening. This is done by checking your blood sugar (glucose) after you have not eaten for a while (fasting). You may have this done every 1-3 years. Bone density scan. This is done to screen for osteoporosis. You may have this done starting at age 48. Mammogram. This may be done every 1-2 years. Talk to your health care provider about how often you should have regular mammograms. Talk with your health care provider about your test results, treatment options, and if necessary, the need for more tests. Vaccines  Your health care provider may recommend certain vaccines, such as: Influenza vaccine. This is recommended every year. Tetanus, diphtheria, and acellular pertussis (Tdap, Td) vaccine. You may need a Td booster every 10 years. Zoster vaccine. You may need this after age 65. Pneumococcal 13-valent conjugate (PCV13) vaccine. One dose is recommended after age 54. Pneumococcal polysaccharide (PPSV23) vaccine. One dose is recommended after age 62. Talk to your health care  provider about which screenings and vaccines you need and how  often you need them. This information is not intended to replace advice given to you by your health care provider. Make sure you discuss any questions you have with your health care provider. Document Released: 09/01/2015 Document Revised: 04/24/2016 Document Reviewed: 06/06/2015 Elsevier Interactive Patient Education  2017 [place] Prevention in the Home Falls can cause injuries. They can happen to people of all ages. There are many things you can do to make your home safe and to help prevent falls. What can I do on the outside of my home? Regularly fix the edges of walkways and driveways and fix any cracks. Remove anything that might make you trip as you walk through a door, such as a raised step or threshold. Trim any bushes or trees on the path to your home. Use bright outdoor lighting. Clear any walking paths of anything that might make someone trip, such as rocks or tools. Regularly check to see if handrails are loose or broken. Make sure that both sides of any steps have handrails. Any raised decks and porches should have guardrails on the edges. Have any leaves, snow, or ice cleared regularly. Use sand or salt on walking paths during winter. Clean up any spills in your garage right away. This includes oil or grease spills. What can I do in the bathroom? Use night lights. Install grab bars by the toilet and in the tub and shower. Do not use towel bars as grab bars. Use non-skid mats or decals in the tub or shower. If you need to sit down in the shower, use a plastic, non-slip stool. Keep the floor dry. Clean up any water that spills on the floor as soon as it happens. Remove soap buildup in the tub or shower regularly. Attach bath mats securely with double-sided non-slip rug tape. Do not have throw rugs and other things on the floor that can make you trip. What can I do in the bedroom? Use night lights. Make sure that you have a light by your bed that is easy to  reach. Do not use any sheets or blankets that are too big for your bed. They should not hang down onto the floor. Have a firm chair that has side arms. You can use this for support while you get dressed. Do not have throw rugs and other things on the floor that can make you trip. What can I do in the kitchen? Clean up any spills right away. Avoid walking on wet floors. Keep items that you use a lot in easy-to-reach places. If you need to reach something above you, use a strong step stool that has a grab bar. Keep electrical cords out of the way. Do not use floor polish or wax that makes floors slippery. If you must use wax, use non-skid floor wax. Do not have throw rugs and other things on the floor that can make you trip. What can I do with my stairs? Do not leave any items on the stairs. Make sure that there are handrails on both sides of the stairs and use them. Fix handrails that are broken or loose. Make sure that handrails are as long as the stairways. Check any carpeting to make sure that it is firmly attached to the stairs. Fix any carpet that is loose or worn. Avoid having throw rugs at the top or bottom of the stairs. If you do have throw rugs, attach them to the  floor with carpet tape. Make sure that you have a light switch at the top of the stairs and the bottom of the stairs. If you do not have them, ask someone to add them for you. What else can I do to help prevent falls? Wear shoes that: Do not have high heels. Have rubber bottoms. Are comfortable and fit you well. Are closed at the toe. Do not wear sandals. If you use a stepladder: Make sure that it is fully opened. Do not climb a closed stepladder. Make sure that both sides of the stepladder are locked into place. Ask someone to hold it for you, if possible. Clearly mark and make sure that you can see: Any grab bars or handrails. First and last steps. Where the edge of each step is. Use tools that help you move  around (mobility aids) if they are needed. These include: Canes. Walkers. Scooters. Crutches. Turn on the lights when you go into a dark area. Replace any light bulbs as soon as they burn out. Set up your furniture so you have a clear path. Avoid moving your furniture around. If any of your floors are uneven, fix them. If there are any pets around you, be aware of where they are. Review your medicines with your doctor. Some medicines can make you feel dizzy. This can increase your chance of falling. Ask your doctor what other things that you can do to help prevent falls. This information is not intended to replace advice given to you by your health care provider. Make sure you discuss any questions you have with your health care provider. Document Released: 06/01/2009 Document Revised: 01/11/2016 Document Reviewed: 09/09/2014 Elsevier Interactive Patient Education  2017 [employer].

## 2021-06-11 ENCOUNTER — Ambulatory Visit: Payer: PPO | Admitting: Nurse Practitioner

## 2021-06-25 ENCOUNTER — Encounter: Payer: Self-pay | Admitting: Gastroenterology

## 2021-06-29 ENCOUNTER — Encounter: Payer: Self-pay | Admitting: Family Medicine

## 2021-06-29 ENCOUNTER — Telehealth (INDEPENDENT_AMBULATORY_CARE_PROVIDER_SITE_OTHER): Payer: PPO | Admitting: Family Medicine

## 2021-06-29 VITALS — Ht 65.0 in | Wt 180.0 lb

## 2021-06-29 DIAGNOSIS — E039 Hypothyroidism, unspecified: Secondary | ICD-10-CM

## 2021-06-29 DIAGNOSIS — M810 Age-related osteoporosis without current pathological fracture: Secondary | ICD-10-CM | POA: Diagnosis not present

## 2021-06-29 DIAGNOSIS — E782 Mixed hyperlipidemia: Secondary | ICD-10-CM

## 2021-06-29 DIAGNOSIS — F325 Major depressive disorder, single episode, in full remission: Secondary | ICD-10-CM | POA: Diagnosis not present
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
## 2021-07-02 ENCOUNTER — Other Ambulatory Visit (INDEPENDENT_AMBULATORY_CARE_PROVIDER_SITE_OTHER): Payer: PPO

## 2021-07-02 ENCOUNTER — Other Ambulatory Visit: Payer: Self-pay

## 2021-07-02 DIAGNOSIS — E782 Mixed hyperlipidemia: Secondary | ICD-10-CM

## 2021-07-02 DIAGNOSIS — E039 Hypothyroidism, unspecified: Secondary | ICD-10-CM

## 2021-07-02 LAB — LIPID PANEL
Cholesterol: 214 mg/dL — ABNORMAL HIGH (ref 0–200)
HDL: 63.4 mg/dL (ref >39.00)
LDL Cholesterol: 123 mg/dL — ABNORMAL HIGH (ref 0–99)
NonHDL: 151.05
Total CHOL/HDL Ratio: 3
Triglycerides: 138 mg/dL (ref 0.0–149.0)
VLDL: 27.6 mg/dL (ref 0.0–40.0)

## 2021-07-02 LAB — TSH: TSH: 5.22 u[IU]/mL (ref 0.35–5.50)

## 2021-07-03 ENCOUNTER — Encounter: Payer: Self-pay | Admitting: Family Medicine

## 2021-07-03 DIAGNOSIS — E782 Mixed hyperlipidemia: Secondary | ICD-10-CM

## 2021-07-03 MED ORDER — ATORVASTATIN CALCIUM 40 MG PO TABS: 60.0000 mg | ORAL_TABLET | Freq: Every day | ORAL | 3 refills | Status: DC

## 2021-07-04 ENCOUNTER — Other Ambulatory Visit: Payer: Self-pay | Admitting: Family Medicine

## 2021-07-04 DIAGNOSIS — F325 Major depressive disorder, single episode, in full remission: Secondary | ICD-10-CM

## 2021-07-04 DIAGNOSIS — E039 Hypothyroidism, unspecified: Secondary | ICD-10-CM

## 2021-07-05 NOTE — Telephone Encounter (Signed)
Called pt and lvm for pt to call back to schedule

## 2021-08-24 ENCOUNTER — Ambulatory Visit (AMBULATORY_SURGERY_CENTER): Payer: Self-pay | Admitting: *Deleted

## 2021-08-24 ENCOUNTER — Other Ambulatory Visit: Payer: Self-pay

## 2021-08-24 VITALS — Ht 66.0 in | Wt 175.0 lb

## 2021-08-24 DIAGNOSIS — Z86010 Personal history of colonic polyps: Secondary | ICD-10-CM

## 2021-08-24 MED ORDER — NA SULFATE-K SULFATE-MG SULF 17.5-3.13-1.6 GM/177ML PO SOLN: 1.0000 | ORAL | 0 refills | Status: DC

## 2021-08-24 NOTE — Progress Notes (Signed)
Patient's pre-visit was done today over the phone with the patient. Name,DOB and address verified. Patient denies any allergies to Eggs and Soy. Patient denies any problems with anesthesia/sedation. Patient is not taking any diet pills or blood thinners. No home Oxygen. Packet of Prep instructions mailed to patient including a copy of a consent form-pt is aware. Prep instructions sent to pt's MyChart (if activated).Patient understands to call us back with any questions or concerns. Patient is aware of our care-partner policy and Covid-19 safety protocol.  ° °EMMI education assigned to the patient for the procedure, sent to MyChart.  ° °The patient is COVID-19 vaccinated.   °

## 2021-08-31 ENCOUNTER — Encounter: Payer: Self-pay | Admitting: Gastroenterology

## 2021-09-07 ENCOUNTER — Other Ambulatory Visit: Payer: Self-pay

## 2021-09-07 ENCOUNTER — Encounter: Payer: Self-pay | Admitting: Family Medicine

## 2021-09-07 ENCOUNTER — Encounter: Payer: Self-pay | Admitting: Gastroenterology

## 2021-09-07 ENCOUNTER — Ambulatory Visit (AMBULATORY_SURGERY_CENTER): Payer: PPO | Admitting: Gastroenterology

## 2021-09-07 VITALS — BP 142/83 | HR 70 | Temp 95.9°F | Resp 14 | Ht 65.0 in | Wt 175.0 lb

## 2021-09-07 DIAGNOSIS — Z86010 Personal history of colonic polyps: Secondary | ICD-10-CM | POA: Diagnosis not present

## 2021-09-07 DIAGNOSIS — K5790 Diverticulosis of intestine, part unspecified, without perforation or abscess without bleeding: Secondary | ICD-10-CM | POA: Insufficient documentation

## 2021-09-07 DIAGNOSIS — D123 Benign neoplasm of transverse colon: Secondary | ICD-10-CM

## 2021-09-07 DIAGNOSIS — D124 Benign neoplasm of descending colon: Secondary | ICD-10-CM

## 2021-09-07 DIAGNOSIS — K648 Other hemorrhoids: Secondary | ICD-10-CM | POA: Insufficient documentation

## 2021-09-07 DIAGNOSIS — D122 Benign neoplasm of ascending colon: Secondary | ICD-10-CM

## 2021-09-07 DIAGNOSIS — D129 Benign neoplasm of anus and anal canal: Secondary | ICD-10-CM

## 2021-09-07 DIAGNOSIS — D128 Benign neoplasm of rectum: Secondary | ICD-10-CM

## 2021-09-07 MED ORDER — SODIUM CHLORIDE 0.9 % IV SOLN: 500.0000 mL | Freq: Once | INTRAVENOUS | Status: DC

## 2021-09-07 NOTE — Op Note (Signed)
Jill Adams: Jill Adams Procedure Date: 09/07/2021 7:51 AM MRN: [id number] Endoscopist: Remo Lipps P. Havery Moros , MD Age: 71 Referring MD:  Date of Birth: [birth year] Gender: Female Account #: [id number] Procedure:                Colonoscopy Indications:              High risk colon cancer surveillance: Personal                            history of colonic polyps - last exam 06/2017 - 10                            polyps removed, most adenomas Medicines:                Monitored Anesthesia Care Procedure:                Pre-Anesthesia Assessment:                           - Prior to the procedure, a History and Physical                            was performed, and patient medications and                            allergies were reviewed. The patient's tolerance of                            previous anesthesia was also reviewed. The risks                            and benefits of the procedure and the sedation                            options and risks were discussed with the patient.                            All questions were answered, and informed consent                            was obtained. Prior Anticoagulants: The patient has                            taken no previous anticoagulant or antiplatelet                            agents. ASA Grade Assessment: II - A patient with                            mild systemic disease. After reviewing the risks                            and benefits, the patient was deemed in  satisfactory condition to undergo the procedure.                           After obtaining informed consent, the colonoscope                            was passed under direct vision. Throughout the                            procedure, the patient's blood pressure, pulse, and                            oxygen saturations were monitored continuously. The                            Olympus PCF-H190DL  [id number]) Colonoscope was                            introduced through the anus and advanced to the the                            cecum, identified by appendiceal orifice and                            ileocecal valve. The colonoscopy was performed                            without difficulty. The patient tolerated the                            procedure well. The quality of the bowel                            preparation was good. The ileocecal valve,                            appendiceal orifice, and rectum were photographed. Scope In: 8:01:11 AM Scope Out: 8:21:28 AM Scope Withdrawal Time: 0 hours 16 minutes 28 seconds  Total Procedure Duration: 0 hours 20 minutes 17 seconds  Findings:                 The perianal and digital rectal examinations were                            normal.                           Multiple small-mouthed diverticula were found in                            the entire colon.                           A diminutive polyp was found in the ascending  colon. The polyp was flat. The polyp was removed                            with a cold snare. Resection and retrieval were                            complete.                           A 5 to 6 mm polyp was found in the transverse                            colon. The polyp was flat. The polyp was removed                            with a cold snare. Resection and retrieval were                            complete.                           A 5 mm polyp was found in the descending colon. The                            polyp was sessile. The polyp was removed with a                            cold snare. Resection and retrieval were complete.                           A 4 mm polyp was found in the rectum. The polyp was                            sessile. The polyp was removed with a cold snare.                            Resection and retrieval were complete.                            Internal hemorrhoids were found during                            retroflexion. The hemorrhoids were small.                           The exam was otherwise without abnormality. Complications:            No immediate complications. Estimated blood loss:                            Minimal. Estimated Blood Loss:     Estimated blood loss was minimal. Impression:               - Diverticulosis in the entire examined colon.                           -  One diminutive polyp in the ascending colon,                            removed with a cold snare. Resected and retrieved.                           - One 5 to 6 mm polyp in the transverse colon,                            removed with a cold snare. Resected and retrieved.                           - One 5 mm polyp in the descending colon, removed                            with a cold snare. Resected and retrieved.                           - One 4 mm polyp in the rectum, removed with a cold                            snare. Resected and retrieved.                           - Internal hemorrhoids.                           - The examination was otherwise normal. Recommendation:           - Patient has a contact number available for                            emergencies. The signs and symptoms of potential                            delayed complications were discussed with the                            patient. Return to normal activities tomorrow.                            Written discharge instructions were provided to the                            patient.                           - Resume previous diet.                           - Continue present medications.                           - Await pathology results. Remo Lipps P. Havery Moros, MD 09/07/2021 8:26:55 AM This report has been signed electronically.

## 2021-09-07 NOTE — Progress Notes (Signed)
Report given to PACU, vss 

## 2021-09-07 NOTE — Patient Instructions (Signed)
Information on polyps, diverticulosis and hemorrhoids given to you today.  Await pathology results.  Resume previous diet and medications.  YOU HAD AN ENDOSCOPIC PROCEDURE TODAY AT THE [facility]:   Refer to the procedure report that was given to you for any specific questions about what was found during the examination.  If the procedure report does not answer your questions, please call your gastroenterologist to clarify.  If you requested that your care partner not be given the details of your procedure findings, then the procedure report has been included in a sealed envelope for you to review at your convenience later.  YOU SHOULD EXPECT: Some feelings of bloating in the abdomen. Passage of more gas than usual.  Walking can help get rid of the air that was put into your GI tract during the procedure and reduce the bloating. If you had a lower endoscopy (such as a colonoscopy or flexible sigmoidoscopy) you may notice spotting of blood in your stool or on the toilet paper. If you underwent a bowel prep for your procedure, you may not have a normal bowel movement for a few days.  Please Note:  You might notice some irritation and congestion in your nose or some drainage.  This is from the oxygen used during your procedure.  There is no need for concern and it should clear up in a day or so.  SYMPTOMS TO REPORT IMMEDIATELY:   Following lower endoscopy (colonoscopy or flexible sigmoidoscopy):  Excessive amounts of blood in the stool  Significant tenderness or worsening of abdominal pains  Swelling of the abdomen that is new, acute  Fever of 100F or higher   For urgent or emergent issues, a gastroenterologist can be reached at any hour by calling [phone]. Do not use MyChart messaging for urgent concerns.    DIET:  We do recommend a small meal at first, but then you may proceed to your regular diet.  Drink plenty of fluids but you should avoid alcoholic beverages for 24  hours.  ACTIVITY:  You should plan to take it easy for the rest of today and you should NOT DRIVE or use heavy machinery until tomorrow (because of the sedation medicines used during the test).    FOLLOW UP: Our staff will call the number listed on your records 48-72 hours following your procedure to check on you and address any questions or concerns that you may have regarding the information given to you following your procedure. If we do not reach you, we will leave a message.  We will attempt to reach you two times.  During this call, we will ask if you have developed any symptoms of COVID 19. If you develop any symptoms (ie: fever, flu-like symptoms, shortness of breath, cough etc.) before then, please call [phone].  If you test positive for Covid 19 in the 2 weeks post procedure, please call and report this information to us.    If any biopsies were taken you will be contacted by phone or by letter within the next 1-3 weeks.  Please call us at [phone] if you have not heard about the biopsies in 3 weeks.    SIGNATURES/CONFIDENTIALITY: You and/or your care partner have signed paperwork which will be entered into your electronic medical record.  These signatures attest to the fact that that the information above on your After Visit Summary has been reviewed and is understood.  Full responsibility of the confidentiality of this discharge information lies with you   and/or your care-partner. 
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
## 2021-09-07 NOTE — Progress Notes (Signed)
VS- Randall Hiss RN  Pt's states no medical or surgical changes since previsit or office visit.

## 2021-09-07 NOTE — Progress Notes (Signed)
Called to room to assist during endoscopic procedure.  Patient ID and intended procedure confirmed with present staff. Received instructions for my participation in the procedure from the performing physician.  

## 2021-09-11 ENCOUNTER — Telehealth: Payer: Self-pay

## 2021-09-11 NOTE — Telephone Encounter (Signed)
°  Follow up Call-  Call back number 09/07/2021  Post procedure Call Back phone  # [phone]  Permission to leave phone message Yes  Some recent data might be hidden     Patient questions:  Do you have a fever, pain , or abdominal swelling? No. Pain Score  0 *  Have you tolerated food without any problems? Yes.    Have you been able to return to your normal activities? Yes.    Do you have any questions about your discharge instructions: Diet   No. Medications  No. Follow up visit  No.  Do you have questions or concerns about your Care? No.  Actions: * If pain score is 4 or above: No action needed, pain <4.  Have you developed a fever since your procedure? no  2.   Have you had an respiratory symptoms (SOB or cough) since your procedure? no  3.   Have you tested positive for COVID 19 since your procedure no  4.   Have you had any family members/close contacts diagnosed with the COVID 19 since your procedure?  no   If yes to any of these questions please route to Joylene John, RN and Joella Prince, RN

## 2021-10-04 ENCOUNTER — Other Ambulatory Visit: Payer: Self-pay | Admitting: Family Medicine

## 2021-10-04 DIAGNOSIS — F325 Major depressive disorder, single episode, in full remission: Secondary | ICD-10-CM

## 2021-10-08 ENCOUNTER — Encounter: Payer: Self-pay | Admitting: Family Medicine

## 2021-10-16 ENCOUNTER — Other Ambulatory Visit: Payer: Self-pay

## 2021-10-17 ENCOUNTER — Ambulatory Visit (INDEPENDENT_AMBULATORY_CARE_PROVIDER_SITE_OTHER): Payer: PPO | Admitting: Family Medicine

## 2021-10-17 VITALS — BP 120/78 | HR 85 | Temp 97.1°F | Ht 65.0 in | Wt 182.0 lb

## 2021-10-17 DIAGNOSIS — H6592 Unspecified nonsuppurative otitis media, left ear: Secondary | ICD-10-CM | POA: Diagnosis not present

## 2021-10-17 MED ORDER — FLUTICASONE PROPIONATE 50 MCG/ACT NA SUSP: 2.0000 | Freq: Every day | NASAL | 6 refills | Status: DC

## 2021-10-17 NOTE — Patient Instructions (Signed)
Otitis Media With Effusion, Adult ?Otitis media with effusion (OME) is inflammation and fluid (effusion) in the middle ear without having an ear infection. The middle ear is the space behind the eardrum. The middle ear is connected to the back of the throat by a narrow tube (eustachian tube). Normally the eustachian tube drains fluid out of the middle ear. A swollen eustachian tube can become blocked and cause fluid to collect in the middle ear. ?OME often goes away without treatment. Sometimes OME can lead to hearing problems and recurrent acute ear infections (acute otitis media). These conditions may require treatment. ?What are the causes? ?OME is caused by a blocked eustachian tube. This can result from: ?Allergies. ?Upper respiratory infections. ?Enlarged adenoids. The adenoids are areas of soft tissue located high in the back of the throat, behind the nose and the roof of the mouth. They are part of the body's natural defense system (immune system). ?Rapid changes in pressure, like when an airplane is descending or during scuba diving. ?In some cases, the cause of this condition is not known. ?What are the signs or symptoms? ?Common symptoms of this condition include: ?A feeling of fullness in your ear. ?Decreased hearing in the affected ear. ?Fluid draining into the ear canal. ?Pain in the ear. ?In some cases, there are no symptoms. ?How is this diagnosed? ?A health care provider can diagnose OME based on signs and symptoms of the condition. Your provider will also do a physical exam to check for fluid behind the eardrum. During the exam, your health care provider will use an instrument called an otoscope to look in your ear. ?Your health care provider may do other tests, such as: ?A hearing test. ?A tympanogram. This is a test that shows how well the eardrum moves in response to air pressure in the ear canal. It provides a graph for your health care provider to review. ?A pneumatic otoscopy. This is a test  to check how your eardrum moves in response to changes in pressure. It is done by squeezing a small amount of air into the ear. ?How is this treated? ?Treatment for OME depends on the cause of the condition and the severity of symptoms. The first step is often waiting to see if the fluid drains on its own in a few weeks. Home care treatment may include: ?Over-the-counter pain relievers. ?A warm, moist cloth placed over the ear. ?Severe cases may require a procedure to insert tubes in the ears (tympanostomy tubes) to drain the fluid. ?Follow these instructions at home: ?Take over-the-counter and prescription medicines only as told by your health care provider. ?Keep all follow-up visits. ?Contact a health care provider if: ?You have pain that gets worse. ?Hearing in your affected ear gets worse. ?You have fluid draining from your ear canal. ?You have dizziness. ?You develop a fever. ?Get help right away if: ?You develop a severe headache. ?You completely lose hearing in the affected ear. ?You have bleeding from your ear canal. ?You have sudden and severe pain in your ear. ?These symptoms may represent a serious problem that is an emergency. Do not wait to see if the symptoms will go away. Get medical help right away. Call your local emergency services (911 in the [place] Do not drive yourself to the hospital. ?Summary ?Otitis media with effusion (OME) is inflammation and fluid (effusion) in the middle ear without having an ear infection. ?A swollen eustachian tube can become blocked and cause fluid to collect in the middle ear. ?  Treatment for OME depends on the cause of the condition and the severity of symptoms. ?Many times, treatment is not needed because the fluid drains on its own in a few weeks. ?Sometimes OME can lead to hearing problems and recurrent acute ear infections (acute otitis media), which may require treatment. ?This information is not intended to replace advice given to you by your health care  provider. Make sure you discuss any questions you have with your health care provider. ?Document Revised: 11/30/2020 Document Reviewed: 11/30/2020 ?Elsevier Patient Education ? [place]. ? ?
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
## 2021-10-22 ENCOUNTER — Encounter: Payer: Self-pay | Admitting: Family Medicine

## 2021-10-29 ENCOUNTER — Encounter: Payer: Self-pay | Admitting: Family Medicine

## 2021-10-31 ENCOUNTER — Other Ambulatory Visit: Payer: Self-pay

## 2021-11-01 ENCOUNTER — Ambulatory Visit (INDEPENDENT_AMBULATORY_CARE_PROVIDER_SITE_OTHER): Payer: PPO | Admitting: Family Medicine

## 2021-11-01 VITALS — BP 124/66 | HR 86 | Temp 97.4°F | Ht 65.0 in | Wt 184.4 lb

## 2021-11-01 DIAGNOSIS — H6593 Unspecified nonsuppurative otitis media, bilateral: Secondary | ICD-10-CM | POA: Diagnosis not present

## 2021-11-01 MED ORDER — METHYLPREDNISOLONE 4 MG PO TBPK: ORAL_TABLET | ORAL | 0 refills | Status: DC

## 2021-11-01 NOTE — Patient Instructions (Signed)
Otitis Media With Effusion, Adult ?Otitis media with effusion (OME) is inflammation and fluid (effusion) in the middle ear without having an ear infection. The middle ear is the space behind the eardrum. The middle ear is connected to the back of the throat by a narrow tube (eustachian tube). Normally the eustachian tube drains fluid out of the middle ear. A swollen eustachian tube can become blocked and cause fluid to collect in the middle ear. ?OME often goes away without treatment. Sometimes OME can lead to hearing problems and recurrent acute ear infections (acute otitis media). These conditions may require treatment. ?What are the causes? ?OME is caused by a blocked eustachian tube. This can result from: ?Allergies. ?Upper respiratory infections. ?Enlarged adenoids. The adenoids are areas of soft tissue located high in the back of the throat, behind the nose and the roof of the mouth. They are part of the body's natural defense system (immune system). ?Rapid changes in pressure, like when an airplane is descending or during scuba diving. ?In some cases, the cause of this condition is not known. ?What are the signs or symptoms? ?Common symptoms of this condition include: ?A feeling of fullness in your ear. ?Decreased hearing in the affected ear. ?Fluid draining into the ear canal. ?Pain in the ear. ?In some cases, there are no symptoms. ?How is this diagnosed? ?A health care provider can diagnose OME based on signs and symptoms of the condition. Your provider will also do a physical exam to check for fluid behind the eardrum. During the exam, your health care provider will use an instrument called an otoscope to look in your ear. ?Your health care provider may do other tests, such as: ?A hearing test. ?A tympanogram. This is a test that shows how well the eardrum moves in response to air pressure in the ear canal. It provides a graph for your health care provider to review. ?A pneumatic otoscopy. This is a test  to check how your eardrum moves in response to changes in pressure. It is done by squeezing a small amount of air into the ear. ?How is this treated? ?Treatment for OME depends on the cause of the condition and the severity of symptoms. The first step is often waiting to see if the fluid drains on its own in a few weeks. Home care treatment may include: ?Over-the-counter pain relievers. ?A warm, moist cloth placed over the ear. ?Severe cases may require a procedure to insert tubes in the ears (tympanostomy tubes) to drain the fluid. ?Follow these instructions at home: ?Take over-the-counter and prescription medicines only as told by your health care provider. ?Keep all follow-up visits. ?Contact a health care provider if: ?You have pain that gets worse. ?Hearing in your affected ear gets worse. ?You have fluid draining from your ear canal. ?You have dizziness. ?You develop a fever. ?Get help right away if: ?You develop a severe headache. ?You completely lose hearing in the affected ear. ?You have bleeding from your ear canal. ?You have sudden and severe pain in your ear. ?These symptoms may represent a serious problem that is an emergency. Do not wait to see if the symptoms will go away. Get medical help right away. Call your local emergency services (911 in the [place] Do not drive yourself to the hospital. ?Summary ?Otitis media with effusion (OME) is inflammation and fluid (effusion) in the middle ear without having an ear infection. ?A swollen eustachian tube can become blocked and cause fluid to collect in the middle ear. ?  Treatment for OME depends on the cause of the condition and the severity of symptoms. ?Many times, treatment is not needed because the fluid drains on its own in a few weeks. ?Sometimes OME can lead to hearing problems and recurrent acute ear infections (acute otitis media), which may require treatment. ?This information is not intended to replace advice given to you by your health care  provider. Make sure you discuss any questions you have with your health care provider. ?Document Revised: 11/30/2020 Document Reviewed: 11/30/2020 ?Elsevier Patient Education ? [place]. ? ?
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
## 2021-11-03 ENCOUNTER — Encounter: Payer: Self-pay | Admitting: Family Medicine

## 2021-11-06 ENCOUNTER — Other Ambulatory Visit: Payer: Self-pay | Admitting: Family Medicine

## 2021-11-06 DIAGNOSIS — E039 Hypothyroidism, unspecified: Secondary | ICD-10-CM

## 2021-11-13 ENCOUNTER — Encounter: Payer: Self-pay | Admitting: Family Medicine

## 2021-11-13 DIAGNOSIS — H6593 Unspecified nonsuppurative otitis media, bilateral: Secondary | ICD-10-CM

## 2021-12-11 ENCOUNTER — Encounter: Payer: Self-pay | Admitting: Family Medicine

## 2021-12-21 ENCOUNTER — Other Ambulatory Visit: Payer: Self-pay | Admitting: Family Medicine

## 2021-12-21 DIAGNOSIS — F325 Major depressive disorder, single episode, in full remission: Secondary | ICD-10-CM

## 2021-12-26 ENCOUNTER — Other Ambulatory Visit: Payer: Self-pay | Admitting: Family Medicine

## 2021-12-26 DIAGNOSIS — N951 Menopausal and female climacteric states: Secondary | ICD-10-CM

## 2021-12-26 DIAGNOSIS — Z1231 Encounter for screening mammogram for malignant neoplasm of breast: Secondary | ICD-10-CM

## 2021-12-26 DIAGNOSIS — Z13820 Encounter for screening for osteoporosis: Secondary | ICD-10-CM

## 2022-01-04 ENCOUNTER — Ambulatory Visit: Payer: PPO

## 2022-01-15 NOTE — Telephone Encounter (Signed)
Please advise 

## 2022-01-18 ENCOUNTER — Ambulatory Visit
Admission: RE | Admit: 2022-01-18 | Discharge: 2022-01-18 | Disposition: A | Discharge status: Home / Self Care | Payer: PPO | Source: Ambulatory Visit | Attending: Family Medicine | Admitting: Family Medicine

## 2022-01-18 DIAGNOSIS — Z1231 Encounter for screening mammogram for malignant neoplasm of breast: Secondary | ICD-10-CM

## 2022-02-20 ENCOUNTER — Other Ambulatory Visit: Payer: Self-pay | Admitting: Otolaryngology

## 2022-02-20 DIAGNOSIS — H918X9 Other specified hearing loss, unspecified ear: Secondary | ICD-10-CM

## 2022-03-06 ENCOUNTER — Ambulatory Visit
Admission: RE | Admit: 2022-03-06 | Discharge: 2022-03-06 | Disposition: A | Discharge status: Home / Self Care | Payer: PPO | Source: Ambulatory Visit | Attending: Otolaryngology | Admitting: Otolaryngology

## 2022-03-06 DIAGNOSIS — H918X9 Other specified hearing loss, unspecified ear: Secondary | ICD-10-CM

## 2022-03-06 MED ORDER — GADOBENATE DIMEGLUMINE 529 MG/ML IV SOLN
17.0000 mL | Freq: Once | INTRAVENOUS | Status: AC | PRN
  Administered 2022-03-06: 17 mL via INTRAVENOUS

## 2022-03-19 ENCOUNTER — Other Ambulatory Visit: Payer: Self-pay | Admitting: Family Medicine

## 2022-03-19 DIAGNOSIS — E039 Hypothyroidism, unspecified: Secondary | ICD-10-CM

## 2022-03-20 ENCOUNTER — Telehealth: Payer: Self-pay

## 2022-03-20 NOTE — Telephone Encounter (Signed)
Unable to reach pt, lvm for pt to call back.  To schedule appointment with provider

## 2022-03-25 ENCOUNTER — Ambulatory Visit: Payer: PPO | Admitting: Family Medicine

## 2022-03-27 ENCOUNTER — Encounter: Payer: Self-pay | Admitting: Family Medicine

## 2022-03-27 ENCOUNTER — Ambulatory Visit (INDEPENDENT_AMBULATORY_CARE_PROVIDER_SITE_OTHER): Payer: PPO | Admitting: Family Medicine

## 2022-03-27 VITALS — BP 124/82 | HR 82 | Temp 98.0°F | Ht 65.0 in | Wt 181.6 lb

## 2022-03-27 DIAGNOSIS — M545 Low back pain, unspecified: Secondary | ICD-10-CM

## 2022-03-27 DIAGNOSIS — M810 Age-related osteoporosis without current pathological fracture: Secondary | ICD-10-CM | POA: Diagnosis not present

## 2022-03-27 DIAGNOSIS — E039 Hypothyroidism, unspecified: Secondary | ICD-10-CM | POA: Diagnosis not present

## 2022-03-27 DIAGNOSIS — E782 Mixed hyperlipidemia: Secondary | ICD-10-CM

## 2022-03-27 DIAGNOSIS — H6593 Unspecified nonsuppurative otitis media, bilateral: Secondary | ICD-10-CM | POA: Diagnosis not present

## 2022-03-27 DIAGNOSIS — D329 Benign neoplasm of meninges, unspecified: Secondary | ICD-10-CM | POA: Insufficient documentation

## 2022-03-27 MED ORDER — ALENDRONATE SODIUM 35 MG PO TABS: 70.0000 mg | ORAL_TABLET | ORAL | 1 refills | Status: DC

## 2022-03-27 MED ORDER — FLUTICASONE PROPIONATE 50 MCG/ACT NA SUSP: 2.0000 | Freq: Every day | NASAL | 6 refills | Status: DC
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
## 2022-03-29 ENCOUNTER — Other Ambulatory Visit (INDEPENDENT_AMBULATORY_CARE_PROVIDER_SITE_OTHER): Payer: PPO

## 2022-03-29 DIAGNOSIS — E039 Hypothyroidism, unspecified: Secondary | ICD-10-CM | POA: Diagnosis not present

## 2022-03-29 DIAGNOSIS — E782 Mixed hyperlipidemia: Secondary | ICD-10-CM | POA: Diagnosis not present

## 2022-03-29 LAB — LIPID PANEL
Cholesterol: 201 mg/dL — ABNORMAL HIGH (ref 0–200)
HDL: 61.2 mg/dL (ref >39.00)
LDL Cholesterol: 112 mg/dL — ABNORMAL HIGH (ref 0–99)
NonHDL: 139.35
Total CHOL/HDL Ratio: 3
Triglycerides: 136 mg/dL (ref 0.0–149.0)
VLDL: 27.2 mg/dL (ref 0.0–40.0)

## 2022-03-29 LAB — TSH: TSH: 9.72 u[IU]/mL — ABNORMAL HIGH (ref 0.35–5.50)

## 2022-03-29 MED ORDER — LEVOTHYROXINE SODIUM 75 MCG PO TABS: 75.0000 ug | ORAL_TABLET | Freq: Every day | ORAL | 3 refills | Status: DC

## 2022-03-29 NOTE — Addendum Note (Signed)
Addended by: [name] on: 03/29/2022 03:36 PM   Modules accepted: Orders

## 2022-04-23 ENCOUNTER — Other Ambulatory Visit: Payer: Self-pay | Admitting: Family Medicine

## 2022-04-23 DIAGNOSIS — E782 Mixed hyperlipidemia: Secondary | ICD-10-CM

## 2022-05-01 ENCOUNTER — Encounter: Payer: Self-pay | Admitting: Family Medicine

## 2022-05-02 ENCOUNTER — Encounter: Payer: Self-pay | Admitting: Family Medicine

## 2022-05-02 ENCOUNTER — Telehealth (INDEPENDENT_AMBULATORY_CARE_PROVIDER_SITE_OTHER): Payer: PPO | Admitting: Family Medicine

## 2022-05-02 VITALS — Ht 65.0 in | Wt 181.0 lb

## 2022-05-02 DIAGNOSIS — L905 Scar conditions and fibrosis of skin: Secondary | ICD-10-CM

## 2022-05-02 DIAGNOSIS — Z87828 Personal history of other (healed) physical injury and trauma: Secondary | ICD-10-CM | POA: Diagnosis not present
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
## 2022-05-13 ENCOUNTER — Telehealth: Payer: Self-pay | Admitting: Family Medicine

## 2022-05-13 NOTE — Telephone Encounter (Signed)
Left message for patient to call back and schedule Medicare Annual Wellness Visit (AWV).   Please offer to do virtually or by telephone.  Left office number and my jabber [phone].  Last AWV:05/22/2021  Please schedule at anytime with Nurse Health Advisor.

## 2022-05-28 ENCOUNTER — Encounter: Payer: Self-pay | Admitting: Family Medicine

## 2022-05-31 ENCOUNTER — Ambulatory Visit: Payer: PPO | Admitting: Family Medicine

## 2022-06-06 ENCOUNTER — Ambulatory Visit (INDEPENDENT_AMBULATORY_CARE_PROVIDER_SITE_OTHER): Payer: PPO

## 2022-06-06 VITALS — Ht 65.0 in | Wt 180.0 lb

## 2022-06-06 DIAGNOSIS — Z0000 Encounter for general adult medical examination without abnormal findings: Secondary | ICD-10-CM | POA: Diagnosis not present
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
## 2022-06-06 NOTE — Patient Instructions (Signed)
Jill Adams , Thank you for taking time to come for your Medicare Wellness Visit. I appreciate your ongoing commitment to your health goals. Please review the following plan we discussed and let me know if I can assist you in the future.   These are the goals we discussed:  Goals      Increase physical activity     Increase physical activity     Patient Stated     Weight bearing exercise .   Pick up more exercise.             This is a list of the screening recommended for you and due dates:  Health Maintenance  Topic Date Due   Tetanus Vaccine  Never done   Mammogram  01/19/2023   Colon Cancer Screening  09/07/2026   Pneumonia Vaccine  Completed   Flu Shot  Completed   DEXA scan (bone density measurement)  Completed   Hepatitis C Screening: USPSTF Recommendation to screen - Ages 42-79 yo.  Completed   Zoster (Shingles) Vaccine  Completed   HPV Vaccine  Aged Out   COVID-19 Vaccine  Discontinued    Advanced directives: Please bring a copy of your health care power of attorney and living will to the office to be added to your chart at your convenience.   Conditions/risks identified: Aim for 30 minutes of exercise or brisk walking, 6-8 glasses of water, and 5 servings of fruits and vegetables each day.   Next appointment: Follow up in one year for your annual wellness visit    Preventive Care 65 Years and Older, Female Preventive care refers to lifestyle choices and visits with your health care provider that can promote health and wellness. What does preventive care include? A yearly physical exam. This is also called an annual well check. Dental exams once or twice a year. Routine eye exams. Ask your health care provider how often you should have your eyes checked. Personal lifestyle choices, including: Daily care of your teeth and gums. Regular physical activity. Eating a healthy diet. Avoiding tobacco and drug use. Limiting alcohol use. Practicing safe sex. Taking  low-dose aspirin every day. Taking vitamin and mineral supplements as recommended by your health care provider. What happens during an annual well check? The services and screenings done by your health care provider during your annual well check will depend on your age, overall health, lifestyle risk factors, and family history of disease. Counseling  Your health care provider may ask you questions about your: Alcohol use. Tobacco use. Drug use. Emotional well-being. Home and relationship well-being. Sexual activity. Eating habits. History of falls. Memory and ability to understand (cognition). Work and work Statistician. Reproductive health. Screening  You may have the following tests or measurements: Height, weight, and BMI. Blood pressure. Lipid and cholesterol levels. These may be checked every 5 years, or more frequently if you are over 40 years old. Skin check. Lung cancer screening. You may have this screening every year starting at age 4 if you have a 30-pack-year history of smoking and currently smoke or have quit within the past 15 years. Fecal occult blood test (FOBT) of the stool. You may have this test every year starting at age 57. Flexible sigmoidoscopy or colonoscopy. You may have a sigmoidoscopy every 5 years or a colonoscopy every 10 years starting at age 73. Hepatitis C blood test. Hepatitis B blood test. Sexually transmitted disease (STD) testing. Diabetes screening. This is done by checking your blood sugar (glucose) after  you have not eaten for a while (fasting). You may have this done every 1-3 years. Bone density scan. This is done to screen for osteoporosis. You may have this done starting at age 2. Mammogram. This may be done every 1-2 years. Talk to your health care provider about how often you should have regular mammograms. Talk with your health care provider about your test results, treatment options, and if necessary, the need for more tests. Vaccines   Your health care provider may recommend certain vaccines, such as: Influenza vaccine. This is recommended every year. Tetanus, diphtheria, and acellular pertussis (Tdap, Td) vaccine. You may need a Td booster every 10 years. Zoster vaccine. You may need this after age 29. Pneumococcal 13-valent conjugate (PCV13) vaccine. One dose is recommended after age 40. Pneumococcal polysaccharide (PPSV23) vaccine. One dose is recommended after age 15. Talk to your health care provider about which screenings and vaccines you need and how often you need them. This information is not intended to replace advice given to you by your health care provider. Make sure you discuss any questions you have with your health care provider. Document Released: 09/01/2015 Document Revised: 04/24/2016 Document Reviewed: 06/06/2015 Elsevier Interactive Patient Education  2017 [employer] Prevention in the Home Falls can cause injuries. They can happen to people of all ages. There are many things you can do to make your home safe and to help prevent falls. What can I do on the outside of my home? Regularly fix the edges of walkways and driveways and fix any cracks. Remove anything that might make you trip as you walk through a door, such as a raised step or threshold. Trim any bushes or trees on the path to your home. Use bright outdoor lighting. Clear any walking paths of anything that might make someone trip, such as rocks or tools. Regularly check to see if handrails are loose or broken. Make sure that both sides of any steps have handrails. Any raised decks and porches should have guardrails on the edges. Have any leaves, snow, or ice cleared regularly. Use sand or salt on walking paths during winter. Clean up any spills in your garage right away. This includes oil or grease spills. What can I do in the bathroom? Use night lights. Install grab bars by the toilet and in the tub and shower. Do not use towel  bars as grab bars. Use non-skid mats or decals in the tub or shower. If you need to sit down in the shower, use a plastic, non-slip stool. Keep the floor dry. Clean up any water that spills on the floor as soon as it happens. Remove soap buildup in the tub or shower regularly. Attach bath mats securely with double-sided non-slip rug tape. Do not have throw rugs and other things on the floor that can make you trip. What can I do in the bedroom? Use night lights. Make sure that you have a light by your bed that is easy to reach. Do not use any sheets or blankets that are too big for your bed. They should not hang down onto the floor. Have a firm chair that has side arms. You can use this for support while you get dressed. Do not have throw rugs and other things on the floor that can make you trip. What can I do in the kitchen? Clean up any spills right away. Avoid walking on wet floors. Keep items that you use a lot in easy-to-reach places. If you  need to reach something above you, use a strong step stool that has a grab bar. Keep electrical cords out of the way. Do not use floor polish or wax that makes floors slippery. If you must use wax, use non-skid floor wax. Do not have throw rugs and other things on the floor that can make you trip. What can I do with my stairs? Do not leave any items on the stairs. Make sure that there are handrails on both sides of the stairs and use them. Fix handrails that are broken or loose. Make sure that handrails are as long as the stairways. Check any carpeting to make sure that it is firmly attached to the stairs. Fix any carpet that is loose or worn. Avoid having throw rugs at the top or bottom of the stairs. If you do have throw rugs, attach them to the floor with carpet tape. Make sure that you have a light switch at the top of the stairs and the bottom of the stairs. If you do not have them, ask someone to add them for you. What else can I do to help  prevent falls? Wear shoes that: Do not have high heels. Have rubber bottoms. Are comfortable and fit you well. Are closed at the toe. Do not wear sandals. If you use a stepladder: Make sure that it is fully opened. Do not climb a closed stepladder. Make sure that both sides of the stepladder are locked into place. Ask someone to hold it for you, if possible. Clearly mark and make sure that you can see: Any grab bars or handrails. First and last steps. Where the edge of each step is. Use tools that help you move around (mobility aids) if they are needed. These include: Canes. Walkers. Scooters. Crutches. Turn on the lights when you go into a dark area. Replace any light bulbs as soon as they burn out. Set up your furniture so you have a clear path. Avoid moving your furniture around. If any of your floors are uneven, fix them. If there are any pets around you, be aware of where they are. Review your medicines with your doctor. Some medicines can make you feel dizzy. This can increase your chance of falling. Ask your doctor what other things that you can do to help prevent falls. This information is not intended to replace advice given to you by your health care provider. Make sure you discuss any questions you have with your health care provider. Document Released: 06/01/2009 Document Revised: 01/11/2016 Document Reviewed: 09/09/2014 Elsevier Interactive Patient Education  2017 [employer].

## 2022-06-07 ENCOUNTER — Encounter: Payer: Self-pay | Admitting: Nurse Practitioner

## 2022-06-07 ENCOUNTER — Ambulatory Visit (INDEPENDENT_AMBULATORY_CARE_PROVIDER_SITE_OTHER): Payer: PPO

## 2022-06-07 ENCOUNTER — Ambulatory Visit (INDEPENDENT_AMBULATORY_CARE_PROVIDER_SITE_OTHER): Payer: PPO | Admitting: Nurse Practitioner

## 2022-06-07 VITALS — BP 130/80 | HR 98 | Temp 97.1°F | Wt 183.6 lb

## 2022-06-07 DIAGNOSIS — M25562 Pain in left knee: Secondary | ICD-10-CM

## 2022-06-07 NOTE — Patient Instructions (Signed)
It was great to see you!  We are getting an x-ray of your knee today.   Continue aleve as needed for pain. I have attached some stretches you can do daily.   Let's follow-up if your symptoms worsen or don't improve.   Take care,  Vance Peper, NP

## 2022-06-07 NOTE — Progress Notes (Unsigned)
Initial visit left knee pain Denies injury
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
## 2022-06-10 ENCOUNTER — Encounter: Payer: Self-pay | Admitting: Nurse Practitioner

## 2022-07-22 ENCOUNTER — Ambulatory Visit (INDEPENDENT_AMBULATORY_CARE_PROVIDER_SITE_OTHER): Payer: PPO | Admitting: Family Medicine

## 2022-07-22 ENCOUNTER — Encounter: Payer: Self-pay | Admitting: Family Medicine

## 2022-07-22 VITALS — BP 128/70 | HR 80 | Temp 97.6°F | Ht 65.0 in | Wt 188.6 lb

## 2022-07-22 DIAGNOSIS — K029 Dental caries, unspecified: Secondary | ICD-10-CM | POA: Diagnosis not present

## 2022-07-22 DIAGNOSIS — K047 Periapical abscess without sinus: Secondary | ICD-10-CM

## 2022-07-22 MED ORDER — AMOXICILLIN-POT CLAVULANATE 875-125 MG PO TABS: 1.0000 | ORAL_TABLET | Freq: Two times a day (BID) | ORAL | 0 refills | Status: DC
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
## 2022-08-05 ENCOUNTER — Ambulatory Visit
Admission: RE | Admit: 2022-08-05 | Discharge: 2022-08-05 | Disposition: A | Discharge status: Home / Self Care | Payer: PPO | Source: Ambulatory Visit | Attending: Family Medicine | Admitting: Family Medicine

## 2022-08-05 DIAGNOSIS — N951 Menopausal and female climacteric states: Secondary | ICD-10-CM

## 2022-08-05 DIAGNOSIS — Z13820 Encounter for screening for osteoporosis: Secondary | ICD-10-CM

## 2022-09-20 DIAGNOSIS — F4321 Adjustment disorder with depressed mood: Secondary | ICD-10-CM | POA: Diagnosis not present

## 2022-09-27 ENCOUNTER — Ambulatory Visit: Payer: PPO | Admitting: Family Medicine

## 2022-10-04 DIAGNOSIS — F4321 Adjustment disorder with depressed mood: Secondary | ICD-10-CM | POA: Diagnosis not present

## 2022-10-07 ENCOUNTER — Encounter: Payer: Self-pay | Admitting: Family Medicine

## 2022-10-18 DIAGNOSIS — F4321 Adjustment disorder with depressed mood: Secondary | ICD-10-CM | POA: Diagnosis not present

## 2022-10-21 ENCOUNTER — Other Ambulatory Visit: Payer: Self-pay | Admitting: Family Medicine

## 2022-10-21 ENCOUNTER — Encounter: Payer: Self-pay | Admitting: Family Medicine

## 2022-10-21 DIAGNOSIS — F325 Major depressive disorder, single episode, in full remission: Secondary | ICD-10-CM

## 2022-11-11 ENCOUNTER — Ambulatory Visit (INDEPENDENT_AMBULATORY_CARE_PROVIDER_SITE_OTHER): Payer: PPO | Admitting: Family Medicine

## 2022-11-11 ENCOUNTER — Encounter: Payer: Self-pay | Admitting: Family Medicine

## 2022-11-11 VITALS — BP 122/70 | HR 93 | Temp 97.8°F | Ht 65.0 in | Wt 187.2 lb

## 2022-11-11 DIAGNOSIS — M1711 Unilateral primary osteoarthritis, right knee: Secondary | ICD-10-CM | POA: Insufficient documentation

## 2022-11-11 DIAGNOSIS — M170 Bilateral primary osteoarthritis of knee: Secondary | ICD-10-CM | POA: Insufficient documentation

## 2022-11-11 DIAGNOSIS — M25562 Pain in left knee: Secondary | ICD-10-CM | POA: Diagnosis not present

## 2022-11-11 MED ORDER — NAPROXEN 500 MG PO TABS: 500.0000 mg | ORAL_TABLET | Freq: Two times a day (BID) | ORAL | 0 refills | Status: DC

## 2022-11-11 NOTE — Assessment & Plan Note (Signed)
I will have her initially keep activity light. I prescribed a 1-week course of naproxen. She should use heat. If not improving, I will refer her to orthopedics to consider a steroid injection.
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
## 2022-11-15 ENCOUNTER — Encounter: Payer: Self-pay | Admitting: Family Medicine

## 2023-02-08 ENCOUNTER — Other Ambulatory Visit: Payer: Self-pay | Admitting: Neurosurgery

## 2023-02-08 DIAGNOSIS — D320 Benign neoplasm of cerebral meninges: Secondary | ICD-10-CM

## 2023-02-18 ENCOUNTER — Encounter: Payer: Self-pay | Admitting: Family Medicine

## 2023-02-18 DIAGNOSIS — Z13820 Encounter for screening for osteoporosis: Secondary | ICD-10-CM

## 2023-02-18 DIAGNOSIS — Z780 Asymptomatic menopausal state: Secondary | ICD-10-CM

## 2023-02-18 DIAGNOSIS — Z1231 Encounter for screening mammogram for malignant neoplasm of breast: Secondary | ICD-10-CM

## 2023-03-04 ENCOUNTER — Other Ambulatory Visit: Payer: Self-pay | Admitting: Family Medicine

## 2023-03-04 DIAGNOSIS — E782 Mixed hyperlipidemia: Secondary | ICD-10-CM

## 2023-03-11 ENCOUNTER — Telehealth: Payer: Self-pay | Admitting: *Deleted

## 2023-03-11 NOTE — Telephone Encounter (Signed)
I connected with Jill Adams on 7/23 at 1342 by telephone and verified that I am speaking with the correct person using two identifiers. According to the patient's chart they are due for physical with [facility]. Pt scheduled. There are no transportation issues at this time. Nothing further was needed at the end of our conversation.

## 2023-03-15 ENCOUNTER — Other Ambulatory Visit: Payer: Self-pay | Admitting: Family Medicine

## 2023-03-15 DIAGNOSIS — F325 Major depressive disorder, single episode, in full remission: Secondary | ICD-10-CM

## 2023-03-19 ENCOUNTER — Encounter: Payer: Self-pay | Admitting: Family Medicine

## 2023-03-22 ENCOUNTER — Ambulatory Visit
Admission: RE | Admit: 2023-03-22 | Discharge: 2023-03-22 | Disposition: A | Discharge status: Home / Self Care | Payer: PPO | Source: Ambulatory Visit | Attending: Neurosurgery | Admitting: Neurosurgery

## 2023-03-22 DIAGNOSIS — D320 Benign neoplasm of cerebral meninges: Secondary | ICD-10-CM | POA: Diagnosis not present

## 2023-03-22 MED ORDER — GADOPICLENOL 0.5 MMOL/ML IV SOLN
8.0000 mL | Freq: Once | INTRAVENOUS | Status: AC | PRN
  Administered 2023-03-22: 8 mL via INTRAVENOUS

## 2023-04-03 ENCOUNTER — Encounter (INDEPENDENT_AMBULATORY_CARE_PROVIDER_SITE_OTHER): Payer: Self-pay

## 2023-04-07 ENCOUNTER — Ambulatory Visit
Admission: RE | Admit: 2023-04-07 | Discharge: 2023-04-07 | Disposition: A | Discharge status: Home / Self Care | Payer: PPO | Source: Ambulatory Visit | Attending: Family Medicine | Admitting: Family Medicine

## 2023-04-07 DIAGNOSIS — Z1231 Encounter for screening mammogram for malignant neoplasm of breast: Secondary | ICD-10-CM

## 2023-04-09 ENCOUNTER — Other Ambulatory Visit: Payer: Self-pay | Admitting: Family Medicine

## 2023-04-09 ENCOUNTER — Encounter: Payer: Self-pay | Admitting: Family Medicine

## 2023-04-09 DIAGNOSIS — H6983 Other specified disorders of Eustachian tube, bilateral: Secondary | ICD-10-CM | POA: Diagnosis not present

## 2023-04-09 DIAGNOSIS — E039 Hypothyroidism, unspecified: Secondary | ICD-10-CM

## 2023-04-09 DIAGNOSIS — H903 Sensorineural hearing loss, bilateral: Secondary | ICD-10-CM | POA: Diagnosis not present

## 2023-04-10 ENCOUNTER — Other Ambulatory Visit: Payer: Self-pay | Admitting: Family Medicine

## 2023-04-10 DIAGNOSIS — R928 Other abnormal and inconclusive findings on diagnostic imaging of breast: Secondary | ICD-10-CM

## 2023-04-25 ENCOUNTER — Other Ambulatory Visit: Payer: Self-pay | Admitting: Family Medicine

## 2023-04-25 ENCOUNTER — Ambulatory Visit
Admission: RE | Admit: 2023-04-25 | Discharge: 2023-04-25 | Disposition: A | Discharge status: Home / Self Care | Payer: PPO | Source: Ambulatory Visit | Attending: Family Medicine

## 2023-04-25 ENCOUNTER — Ambulatory Visit
Admission: RE | Admit: 2023-04-25 | Discharge: 2023-04-25 | Disposition: A | Discharge status: Home / Self Care | Payer: PPO | Source: Ambulatory Visit | Attending: Family Medicine | Admitting: Family Medicine

## 2023-04-25 DIAGNOSIS — N6322 Unspecified lump in the left breast, upper inner quadrant: Secondary | ICD-10-CM | POA: Diagnosis not present

## 2023-04-25 DIAGNOSIS — R928 Other abnormal and inconclusive findings on diagnostic imaging of breast: Secondary | ICD-10-CM

## 2023-04-25 DIAGNOSIS — N6320 Unspecified lump in the left breast, unspecified quadrant: Secondary | ICD-10-CM | POA: Diagnosis not present

## 2023-04-28 ENCOUNTER — Ambulatory Visit
Admission: RE | Admit: 2023-04-28 | Discharge: 2023-04-28 | Disposition: A | Discharge status: Home / Self Care | Payer: PPO | Source: Ambulatory Visit | Attending: Family Medicine | Admitting: Family Medicine

## 2023-04-28 ENCOUNTER — Encounter: Payer: PPO | Admitting: Family Medicine

## 2023-04-28 DIAGNOSIS — N6320 Unspecified lump in the left breast, unspecified quadrant: Secondary | ICD-10-CM

## 2023-04-28 DIAGNOSIS — R928 Other abnormal and inconclusive findings on diagnostic imaging of breast: Secondary | ICD-10-CM

## 2023-04-28 DIAGNOSIS — C50212 Malignant neoplasm of upper-inner quadrant of left female breast: Secondary | ICD-10-CM | POA: Diagnosis not present

## 2023-04-28 DIAGNOSIS — N6322 Unspecified lump in the left breast, upper inner quadrant: Secondary | ICD-10-CM | POA: Diagnosis not present

## 2023-04-28 HISTORY — PX: BREAST BIOPSY: SHX20

## 2023-04-29 LAB — SURGICAL PATHOLOGY

## 2023-05-01 ENCOUNTER — Encounter: Payer: Self-pay | Admitting: Family Medicine

## 2023-05-05 ENCOUNTER — Encounter: Payer: Self-pay | Admitting: *Deleted

## 2023-05-05 DIAGNOSIS — Z170 Estrogen receptor positive status [ER+]: Secondary | ICD-10-CM

## 2023-05-05 DIAGNOSIS — C50212 Malignant neoplasm of upper-inner quadrant of left female breast: Secondary | ICD-10-CM | POA: Insufficient documentation

## 2023-05-07 ENCOUNTER — Inpatient Hospital Stay: Payer: PPO | Attending: Hematology and Oncology

## 2023-05-07 ENCOUNTER — Inpatient Hospital Stay (HOSPITAL_BASED_OUTPATIENT_CLINIC_OR_DEPARTMENT_OTHER): Payer: PPO | Admitting: Hematology and Oncology

## 2023-05-07 ENCOUNTER — Ambulatory Visit: Payer: PPO | Admitting: Physical Therapy

## 2023-05-07 ENCOUNTER — Encounter: Payer: Self-pay | Admitting: Genetic Counselor

## 2023-05-07 ENCOUNTER — Ambulatory Visit: Payer: Self-pay | Admitting: Surgery

## 2023-05-07 ENCOUNTER — Inpatient Hospital Stay (HOSPITAL_BASED_OUTPATIENT_CLINIC_OR_DEPARTMENT_OTHER): Payer: PPO | Admitting: Genetic Counselor

## 2023-05-07 ENCOUNTER — Encounter: Payer: Self-pay | Admitting: Hematology and Oncology

## 2023-05-07 ENCOUNTER — Encounter: Payer: Self-pay | Admitting: *Deleted

## 2023-05-07 ENCOUNTER — Ambulatory Visit
Admission: RE | Admit: 2023-05-07 | Discharge: 2023-05-07 | Disposition: A | Discharge status: Home / Self Care | Payer: PPO | Source: Ambulatory Visit | Attending: Radiation Oncology | Admitting: Radiation Oncology

## 2023-05-07 VITALS — BP 116/64 | HR 92 | Temp 98.9°F | Resp 18 | Ht 65.0 in | Wt 185.6 lb

## 2023-05-07 DIAGNOSIS — F17210 Nicotine dependence, cigarettes, uncomplicated: Secondary | ICD-10-CM

## 2023-05-07 DIAGNOSIS — C50912 Malignant neoplasm of unspecified site of left female breast: Secondary | ICD-10-CM

## 2023-05-07 DIAGNOSIS — C50212 Malignant neoplasm of upper-inner quadrant of left female breast: Secondary | ICD-10-CM

## 2023-05-07 DIAGNOSIS — Z170 Estrogen receptor positive status [ER+]: Secondary | ICD-10-CM | POA: Insufficient documentation

## 2023-05-07 DIAGNOSIS — Z8041 Family history of malignant neoplasm of ovary: Secondary | ICD-10-CM

## 2023-05-07 DIAGNOSIS — M810 Age-related osteoporosis without current pathological fracture: Secondary | ICD-10-CM | POA: Insufficient documentation

## 2023-05-07 DIAGNOSIS — Z79899 Other long term (current) drug therapy: Secondary | ICD-10-CM | POA: Insufficient documentation

## 2023-05-07 DIAGNOSIS — Z853 Personal history of malignant neoplasm of breast: Secondary | ICD-10-CM | POA: Diagnosis not present

## 2023-05-07 LAB — CBC WITH DIFFERENTIAL (CANCER CENTER ONLY)
Abs Immature Granulocytes: 0.02 10*3/uL (ref 0.00–0.07)
Basophils Absolute: 0.1 10*3/uL (ref 0.0–0.1)
Basophils Relative: 1 %
Eosinophils Absolute: 0.2 10*3/uL (ref 0.0–0.5)
Eosinophils Relative: 3 %
HCT: 40.5 % (ref 36.0–46.0)
Hemoglobin: 14 g/dL (ref 12.0–15.0)
Immature Granulocytes: 0 %
Lymphocytes Relative: 37 %
Lymphs Abs: 2.5 10*3/uL (ref 0.7–4.0)
MCH: 34.1 pg — ABNORMAL HIGH (ref 26.0–34.0)
MCHC: 34.6 g/dL (ref 30.0–36.0)
MCV: 98.5 fL (ref 80.0–100.0)
Monocytes Absolute: 0.5 10*3/uL (ref 0.1–1.0)
Monocytes Relative: 7 %
Neutro Abs: 3.5 10*3/uL (ref 1.7–7.7)
Neutrophils Relative %: 52 %
Platelet Count: 416 10*3/uL — ABNORMAL HIGH (ref 150–400)
RBC: 4.11 MIL/uL (ref 3.87–5.11)
RDW: 12.9 % (ref 11.5–15.5)
WBC Count: 6.7 10*3/uL (ref 4.0–10.5)
nRBC: 0 % (ref 0.0–0.2)

## 2023-05-07 LAB — CMP (CANCER CENTER ONLY)
ALT: 25 U/L (ref 0–44)
AST: 16 U/L (ref 15–41)
Albumin: 4.2 g/dL (ref 3.5–5.0)
Alkaline Phosphatase: 74 U/L (ref 38–126)
Anion gap: 8 (ref 5–15)
BUN: 15 mg/dL (ref 8–23)
CO2: 25 mmol/L (ref 22–32)
Calcium: 9.8 mg/dL (ref 8.9–10.3)
Chloride: 107 mmol/L (ref 98–111)
Creatinine: 0.74 mg/dL (ref 0.44–1.00)
GFR, Estimated: >60 mL/min (ref >60)
Glucose, Bld: 135 mg/dL — ABNORMAL HIGH (ref 70–99)
Potassium: 4 mmol/L (ref 3.5–5.1)
Sodium: 140 mmol/L (ref 135–145)
Total Bilirubin: 0.4 mg/dL (ref 0.3–1.2)
Total Protein: 7.3 g/dL (ref 6.5–8.1)

## 2023-05-07 LAB — GENETIC SCREENING ORDER
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
## 2023-05-07 NOTE — Assessment & Plan Note (Addendum)
This is a pleasant 72 year old postmenopausal female patient with screen detected left breast mass, further investigation showed 5 mm mass in the left breast upper inner quadrant 6 cm from the nipple.  Pathology from the left breast biopsy confirmed grade 1 IDC, prognostic showed ER 100% positive strong staining, PR 40% positive strong staining, Ki-67 1% and group 5 HER2 negative.  She is presented at the [facility] today.    Assessment and Plan    Invasive Ductal Carcinoma Small (5mm), grade 1, stage 1, ER/PR positive, HER2 negative, with a low proliferation index (KI 67 of 1%). Discussed the favorable prognosis and plan for lumpectomy with potential for Oncotype testing if final size is over 5mm. - Proceed with lumpectomy. - Consider Oncotype testing if final size is over 5mm. - Plan for radiation therapy post-surgery. - Initiate antiestrogen therapy post-radiation, likely with Anastrozole 1mg  daily.  Osteopenia/Osteoporosis History of treatment with Fosamax, currently not on treatment. Discussed the importance of maintaining bone health, especially in the context of planned antiestrogen therapy. - Continue weight-bearing exercises and intake of Vitamin D and calcium-rich foods. - Monitor bone density every two years.  Tobacco Use Half pack per day smoker, expressed intent to quit. - Encourage smoking cessation.  Genetic Risk Family history of ovarian cancer. Discussed the potential benefit of genetic testing. - Proceed with genetic testing for BRCA and other relevant genes.  Follow-up Post-surgery to discuss final pathology report and any necessary adjustments to the treatment plan.
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
## 2023-05-08 ENCOUNTER — Other Ambulatory Visit: Payer: Self-pay | Admitting: Surgery

## 2023-05-08 DIAGNOSIS — C50912 Malignant neoplasm of unspecified site of left female breast: Secondary | ICD-10-CM

## 2023-05-15 ENCOUNTER — Telehealth: Payer: Self-pay

## 2023-05-15 ENCOUNTER — Other Ambulatory Visit: Payer: Self-pay

## 2023-05-15 ENCOUNTER — Encounter (HOSPITAL_BASED_OUTPATIENT_CLINIC_OR_DEPARTMENT_OTHER): Payer: Self-pay | Admitting: Surgery

## 2023-05-15 NOTE — Progress Notes (Signed)
   05/15/23 1427  Pre-op Phone Call  Surgery Date Verified 05/22/23  Arrival Time Verified 1430  Surgery Location Verified [facility]  Medical History Reviewed Yes  Is the patient taking a GLP-1 receptor agonist? No  Does the patient have diabetes? No diagnosis of diabetes  Do you have a history of heart problems? No  Antiarrhythmic device type  (NA)  Does patient have other implanted devices? No  Patient Teaching Enhanced Recovery  Patient educated about smoking cessation 24 hours prior to surgery. Yes  Patient verbalizes understanding of bowel prep? N/A  Med Rec Completed Yes  Take the Following Meds the Morning of Surgery no meds AM of sx; hold MVI and herb/sup/nsaids x5d  Recent  Lab Work, EKG, CXR? No  NPO (Including gum & candy) After midnight  Allowed clear liquids (S)  Water;Gatorade  (diabetics please choose diet or no sugar options);Black Coffee Only (no creamer, milk or cream including half and half) (stop cl liq at 0800; drink eras at 1230; arrive to [facility] 1430)  Patient instructed to stop clear liquids including Carb loading drink at: 1230  Stop Solids, Milk, Candy, and Gum STARTING AT MIDNIGHT  Responsible adult to drive and be with you for 24 hours? Yes  Name & Phone Number for Ride/Caregiver sister, Debarah Crape  No Jewelry, money, nail polish or make-up.  No lotions, powders, perfumes. No shaving  48 hrs. prior to surgery. Yes  Contacts, Dentures & Glasses Will Have to be Removed Before OR. Yes  Please bring your ID and Insurance Card the morning of your surgery. (Surgery Centers Only) Yes  Bring any papers or x-rays with you that your surgeon gave you. Yes  Instructed to contact the location of procedure/ provider if they or anyone in their household develops symptoms or tests positive for COVID-19, has close contact with someone who tests positive for COVID, or has known exposure to any contagious illness. Yes  Call this number the morning of surgery  with any problems that may  cancel your surgery. [phone]  Covid-19 Assessment  Have you had a positive COVID-19 test within the previous 90 days? No  COVID Testing Guidance Proceed with the additional questions.  Patient's surgery required a COVID-19 test (cardiothoracic, complex ENT, and bronchoscopies/ EBUS) No  Have you been unmasked and in close contact with anyone with COVID-19 or COVID-19 symptoms within the past 10 days? No  Do you or anyone in your household currently have any COVID-19 symptoms? No

## 2023-05-15 NOTE — Telephone Encounter (Signed)
[facility] Clinical Social Work  Clinical Social Work was referred by new patient protocol for assessment of psychosocial needs.  Clinical Social Worker contacted patient by phone to offer support and assess for needs.  CSW and patient set up separate appointment.  Marguerita Merles, LCSWA Clinical Social Worker [facility]

## 2023-05-16 ENCOUNTER — Encounter: Payer: PPO | Admitting: Family Medicine

## 2023-05-16 ENCOUNTER — Encounter: Payer: Self-pay | Admitting: *Deleted

## 2023-05-16 ENCOUNTER — Inpatient Hospital Stay: Payer: PPO

## 2023-05-16 ENCOUNTER — Telehealth: Payer: Self-pay | Admitting: *Deleted

## 2023-05-16 DIAGNOSIS — C50212 Malignant neoplasm of upper-inner quadrant of left female breast: Secondary | ICD-10-CM

## 2023-05-16 NOTE — Progress Notes (Signed)
[facility] Clinical Social Work  Initial Assessment   Jill Adams is a 72 y.o. year old female contacted by phone. Clinical Social Work was referred by new patient protocol for assessment of psychosocial needs.   SDOH (Social Determinants of Health) assessments performed: Yes SDOH Interventions    Flowsheet Row Telephone from 03/11/2023 in [facility] Clinical Support from 06/06/2022 in [facility] [employer] Clinical Support from 05/22/2021 in [facility] [employer]  SDOH Interventions     Food Insecurity Interventions -- Intervention Not Indicated Intervention Not Indicated  Housing Interventions -- Intervention Not Indicated Intervention Not Indicated  Transportation Interventions Intervention Not Indicated Intervention Not Indicated Intervention Not Indicated  Alcohol Usage Interventions -- Intervention Not Indicated (Score <7) --  Financial Strain Interventions -- Intervention Not Indicated Intervention Not Indicated  Physical Activity Interventions -- Intervention Not Indicated Intervention Not Indicated  Stress Interventions -- Intervention Not Indicated Intervention Not Indicated  Social Connections Interventions -- Intervention Not Indicated Intervention Not Indicated       SDOH Screenings   Food Insecurity: No Food Insecurity (11/10/2022)  Housing: Low Risk  (11/10/2022)  Transportation Needs: No Transportation Needs (03/11/2023)  Utilities: Not At Risk (06/06/2022)  Alcohol Screen: Low Risk  (11/10/2022)  Depression (PHQ2-9): Low Risk  (11/11/2022)  Financial Resource Strain: Low Risk  (11/10/2022)  Physical Activity: Inactive (11/10/2022)  Social Connections: Moderately Isolated (11/10/2022)  Stress: No Stress Concern Present (11/10/2022)  Tobacco Use: High Risk (05/15/2023)     Distress Screen completed: No     No data to display            Family/Social Information:   Housing Arrangement: patient lives alone Family members/support persons in your life? 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  Income source: Employment and Actor concerns:  No specific concerns at this time Type of concern: None Food access concerns: no Religious or spiritual practice: Not known  Coping/ Adjustment to diagnosis: Patient understands treatment plan and what happens next? yes Concerns about diagnosis and/or treatment: I'm not especially worried about anything Patient reported stressors:  None at this time Hopes and/or priorities: Get through treatment Patient enjoys time with family/ friends Current coping skills/ strengths: Ability for insight , Active sense of humor , Average or above average intelligence , Capable of independent living , Communication skills , Contractor , General fund of knowledge , Motivation for treatment/growth , Physical Health , and Supportive family/friends     SUMMARY: Current SDOH Barriers:  Lacks knowledge of community resource: None at this time  Clinical Social Work Clinical Goal(s):  No clinical social work goals at this time  Interventions: Discussed common feeling and emotions when being diagnosed with cancer, and the importance of support during treatment Informed patient of the support team roles and support services at [facility] Provided CSW contact information and encouraged patient to call with any questions or concerns Provided patient with information about financial grants to assist with medical bills and expenses should they arise,    Follow Up Plan: CSW will follow-up with patient by phone  Patient verbalizes understanding of plan: Yes    Marguerita Merles, LCSWA Clinical Social Worker [facility]

## 2023-05-16 NOTE — Telephone Encounter (Signed)
Spoke with patient to follow up from [facility] and assess navigation needs. Patient denies any questions or concerns at this time. Encouraged her to call should anything arise.Patient verbalized understanding.  

## 2023-05-19 DIAGNOSIS — C50212 Malignant neoplasm of upper-inner quadrant of left female breast: Secondary | ICD-10-CM | POA: Diagnosis not present

## 2023-05-21 ENCOUNTER — Other Ambulatory Visit: Payer: Self-pay | Admitting: Family Medicine

## 2023-05-21 ENCOUNTER — Ambulatory Visit
Admission: RE | Admit: 2023-05-21 | Discharge: 2023-05-21 | Disposition: A | Discharge status: Home / Self Care | Payer: PPO | Source: Ambulatory Visit | Attending: Surgery | Admitting: Surgery

## 2023-05-21 DIAGNOSIS — C50212 Malignant neoplasm of upper-inner quadrant of left female breast: Secondary | ICD-10-CM | POA: Diagnosis not present

## 2023-05-21 DIAGNOSIS — H6593 Unspecified nonsuppurative otitis media, bilateral: Secondary | ICD-10-CM

## 2023-05-21 DIAGNOSIS — C50912 Malignant neoplasm of unspecified site of left female breast: Secondary | ICD-10-CM

## 2023-05-21 HISTORY — PX: BREAST BIOPSY: SHX20

## 2023-05-21 NOTE — Progress Notes (Signed)
Surgical soap given to patient with instructions and patient verbalized understanding.     Enhanced Recovery after Surgery  Enhanced Recovery after Surgery is a protocol used to improve the stress on your body and your recovery after surgery.  Patient Instructions  The night before surgery:  No food after midnight. ONLY clear liquids after midnight  The day of surgery (if you do NOT have diabetes):  Drink ONE (1) Pre-Surgery Clear Ensure as directed.   This drink was given to you during your hospital  pre-op appointment visit. The pre-op nurse will instruct you on the time to drink the  Pre-Surgery Ensure depending on your surgery time. Finish the drink at the designated time by the pre-op nurse.  Nothing else to drink after completing the  Pre-Surgery Clear Ensure.  The day of surgery (if you have diabetes): Drink ONE (1) Gatorade 2 (G2) as directed. This drink was given to you during your hospital  pre-op appointment visit.  The pre-op nurse will instruct you on the time to drink the   Gatorade 2 (G2) depending on your surgery time. Color of the Gatorade may vary. Red is not allowed. Nothing else to drink after completing the  Gatorade 2 (G2).         If you have questions, please contact your surgeon's office.

## 2023-05-22 ENCOUNTER — Encounter (HOSPITAL_BASED_OUTPATIENT_CLINIC_OR_DEPARTMENT_OTHER)
Admission: RE | Disposition: A | Discharge status: Home / Self Care | Payer: Self-pay | Source: Ambulatory Visit | Attending: Surgery

## 2023-05-22 ENCOUNTER — Ambulatory Visit (HOSPITAL_BASED_OUTPATIENT_CLINIC_OR_DEPARTMENT_OTHER): Payer: PPO | Admitting: Anesthesiology

## 2023-05-22 ENCOUNTER — Other Ambulatory Visit: Payer: Self-pay

## 2023-05-22 ENCOUNTER — Ambulatory Visit (HOSPITAL_BASED_OUTPATIENT_CLINIC_OR_DEPARTMENT_OTHER)
Admission: RE | Admit: 2023-05-22 | Discharge: 2023-05-22 | Disposition: A | Discharge status: Home / Self Care | Payer: PPO | Source: Ambulatory Visit | Attending: Surgery | Admitting: Surgery

## 2023-05-22 ENCOUNTER — Encounter (HOSPITAL_BASED_OUTPATIENT_CLINIC_OR_DEPARTMENT_OTHER): Payer: Self-pay | Admitting: Surgery

## 2023-05-22 ENCOUNTER — Ambulatory Visit
Admission: RE | Admit: 2023-05-22 | Discharge: 2023-05-22 | Disposition: A | Discharge status: Home / Self Care | Payer: PPO | Source: Ambulatory Visit | Attending: Surgery | Admitting: Surgery

## 2023-05-22 DIAGNOSIS — C50912 Malignant neoplasm of unspecified site of left female breast: Secondary | ICD-10-CM

## 2023-05-22 DIAGNOSIS — C50212 Malignant neoplasm of upper-inner quadrant of left female breast: Secondary | ICD-10-CM | POA: Diagnosis not present

## 2023-05-22 DIAGNOSIS — Z79890 Hormone replacement therapy (postmenopausal): Secondary | ICD-10-CM | POA: Insufficient documentation

## 2023-05-22 DIAGNOSIS — F17200 Nicotine dependence, unspecified, uncomplicated: Secondary | ICD-10-CM | POA: Diagnosis not present

## 2023-05-22 DIAGNOSIS — F17210 Nicotine dependence, cigarettes, uncomplicated: Secondary | ICD-10-CM | POA: Insufficient documentation

## 2023-05-22 DIAGNOSIS — E039 Hypothyroidism, unspecified: Secondary | ICD-10-CM | POA: Diagnosis not present

## 2023-05-22 DIAGNOSIS — Z170 Estrogen receptor positive status [ER+]: Secondary | ICD-10-CM | POA: Insufficient documentation

## 2023-05-22 HISTORY — PX: BREAST LUMPECTOMY WITH RADIOACTIVE SEED LOCALIZATION: SHX6424

## 2023-05-22 SURGERY — BREAST LUMPECTOMY WITH RADIOACTIVE SEED LOCALIZATION
Anesthesia: General | Site: Breast | Laterality: Left

## 2023-05-22 MED ORDER — ONDANSETRON HCL 4 MG/2ML IJ SOLN
INTRAMUSCULAR | Status: AC
  Filled 2023-05-22: qty 2

## 2023-05-22 MED ORDER — LIDOCAINE 2% (20 MG/ML) 5 ML SYRINGE
INTRAMUSCULAR | Status: AC
  Filled 2023-05-22: qty 5

## 2023-05-22 MED ORDER — DEXAMETHASONE SODIUM PHOSPHATE 4 MG/ML IJ SOLN
INTRAMUSCULAR | Status: DC | PRN
  Administered 2023-05-22: 4 mg via INTRAVENOUS

## 2023-05-22 MED ORDER — BUPIVACAINE-EPINEPHRINE (PF) 0.25% -1:200000 IJ SOLN
INTRAMUSCULAR | Status: DC | PRN
  Administered 2023-05-22: 20 mL

## 2023-05-22 MED ORDER — CEFAZOLIN SODIUM-DEXTROSE 2-4 GM/100ML-% IV SOLN
2.0000 g | INTRAVENOUS | Status: AC
  Administered 2023-05-22: 2 g via INTRAVENOUS

## 2023-05-22 MED ORDER — PROPOFOL 10 MG/ML IV BOLUS
INTRAVENOUS | Status: AC
  Filled 2023-05-22: qty 20

## 2023-05-22 MED ORDER — CHLORHEXIDINE GLUCONATE CLOTH 2 % EX PADS: 6.0000 | MEDICATED_PAD | Freq: Once | CUTANEOUS | Status: DC

## 2023-05-22 MED ORDER — DEXAMETHASONE SODIUM PHOSPHATE 10 MG/ML IJ SOLN
INTRAMUSCULAR | Status: AC
  Filled 2023-05-22: qty 1

## 2023-05-22 MED ORDER — AMISULPRIDE (ANTIEMETIC) 5 MG/2ML IV SOLN: 10.0000 mg | Freq: Once | INTRAVENOUS | Status: DC | PRN

## 2023-05-22 MED ORDER — ACETAMINOPHEN 500 MG PO TABS
ORAL_TABLET | ORAL | Status: AC
  Filled 2023-05-22: qty 2

## 2023-05-22 MED ORDER — GABAPENTIN 300 MG PO CAPS
ORAL_CAPSULE | ORAL | Status: AC
  Filled 2023-05-22: qty 1

## 2023-05-22 MED ORDER — LACTATED RINGERS IV SOLN: INTRAVENOUS | Status: DC

## 2023-05-22 MED ORDER — KETOROLAC TROMETHAMINE 15 MG/ML IJ SOLN: 15.0000 mg | Freq: Once | INTRAMUSCULAR | Status: DC | PRN

## 2023-05-22 MED ORDER — MIDAZOLAM HCL 2 MG/2ML IJ SOLN
INTRAMUSCULAR | Status: AC
  Filled 2023-05-22: qty 2

## 2023-05-22 MED ORDER — ONDANSETRON HCL 4 MG/2ML IJ SOLN: 4.0000 mg | Freq: Once | INTRAMUSCULAR | Status: DC | PRN

## 2023-05-22 MED ORDER — FENTANYL CITRATE (PF) 100 MCG/2ML IJ SOLN
INTRAMUSCULAR | Status: AC
  Filled 2023-05-22: qty 2

## 2023-05-22 MED ORDER — GABAPENTIN 300 MG PO CAPS
300.0000 mg | ORAL_CAPSULE | ORAL | Status: AC
  Administered 2023-05-22: 300 mg via ORAL

## 2023-05-22 MED ORDER — PROPOFOL 10 MG/ML IV BOLUS
INTRAVENOUS | Status: DC | PRN
Start: 2023-05-22 — End: 2023-05-22
  Administered 2023-05-22: 150 mg via INTRAVENOUS

## 2023-05-22 MED ORDER — SODIUM CHLORIDE 0.9 % IV SOLN
INTRAVENOUS | Status: DC | PRN
  Administered 2023-05-22: 500 mL

## 2023-05-22 MED ORDER — FENTANYL CITRATE (PF) 100 MCG/2ML IJ SOLN: 25.0000 ug | INTRAMUSCULAR | Status: DC | PRN

## 2023-05-22 MED ORDER — BUPIVACAINE-EPINEPHRINE (PF) 0.25% -1:200000 IJ SOLN
INTRAMUSCULAR | Status: AC
  Filled 2023-05-22: qty 30

## 2023-05-22 MED ORDER — MIDAZOLAM HCL 5 MG/5ML IJ SOLN
INTRAMUSCULAR | Status: DC | PRN
  Administered 2023-05-22: 1 mg via INTRAVENOUS

## 2023-05-22 MED ORDER — FENTANYL CITRATE (PF) 100 MCG/2ML IJ SOLN
INTRAMUSCULAR | Status: DC | PRN
  Administered 2023-05-22: 50 ug via INTRAVENOUS

## 2023-05-22 MED ORDER — SODIUM CHLORIDE 0.9 % IV SOLN
INTRAVENOUS | Status: AC
  Filled 2023-05-22: qty 10

## 2023-05-22 MED ORDER — PHENYLEPHRINE HCL (PRESSORS) 10 MG/ML IV SOLN
INTRAVENOUS | Status: DC | PRN
Start: 2023-05-22 — End: 2023-05-22
  Administered 2023-05-22: 80 ug via INTRAVENOUS

## 2023-05-22 MED ORDER — LIDOCAINE HCL (CARDIAC) PF 100 MG/5ML IV SOSY
PREFILLED_SYRINGE | INTRAVENOUS | Status: DC | PRN
  Administered 2023-05-22: 60 mg via INTRAVENOUS

## 2023-05-22 MED ORDER — ACETAMINOPHEN 500 MG PO TABS
1000.0000 mg | ORAL_TABLET | ORAL | Status: AC
  Administered 2023-05-22: 1000 mg via ORAL

## 2023-05-22 MED ORDER — ONDANSETRON HCL 4 MG/2ML IJ SOLN
INTRAMUSCULAR | Status: DC | PRN
  Administered 2023-05-22: 4 mg via INTRAVENOUS

## 2023-05-22 MED ORDER — OXYCODONE HCL 5 MG PO TABS: 5.0000 mg | ORAL_TABLET | Freq: Four times a day (QID) | ORAL | 0 refills | Status: DC | PRN

## 2023-05-22 MED ORDER — CEFAZOLIN SODIUM-DEXTROSE 2-4 GM/100ML-% IV SOLN
INTRAVENOUS | Status: AC
  Filled 2023-05-22: qty 100

## 2023-05-22 SURGICAL SUPPLY — 53 items
ADH SKN CLS APL DERMABOND .7 (GAUZE/BANDAGES/DRESSINGS) ×1
APL PRP STRL LF DISP 70% ISPRP (MISCELLANEOUS) ×1
APPLIER CLIP 9.375 MED OPEN (MISCELLANEOUS) ×1
APR CLP MED 9.3 20 MLT OPN (MISCELLANEOUS) ×1
BAG DECANTER FOR FLEXI CONT (MISCELLANEOUS) IMPLANT
BINDER BREAST LRG (GAUZE/BANDAGES/DRESSINGS) IMPLANT
BINDER BREAST MEDIUM (GAUZE/BANDAGES/DRESSINGS) IMPLANT
BINDER BREAST XLRG (GAUZE/BANDAGES/DRESSINGS) IMPLANT
BINDER BREAST XXLRG (GAUZE/BANDAGES/DRESSINGS) IMPLANT
BLADE SURG 15 STRL LF DISP TIS (BLADE) ×1 IMPLANT
BLADE SURG 15 STRL SS (BLADE) ×1
CANISTER SUC SOCK COL 7IN (MISCELLANEOUS) IMPLANT
CANISTER SUCT 1200ML W/VALVE (MISCELLANEOUS) IMPLANT
CHLORAPREP W/TINT 26 (MISCELLANEOUS) ×1 IMPLANT
CLIP APPLIE 9.375 MED OPEN (MISCELLANEOUS) IMPLANT
COVER BACK TABLE 60X90IN (DRAPES) ×1 IMPLANT
COVER MAYO STAND STRL (DRAPES) ×1 IMPLANT
COVER PROBE CYLINDRICAL 5X96 (MISCELLANEOUS) ×1 IMPLANT
DERMABOND ADVANCED .7 DNX12 (GAUZE/BANDAGES/DRESSINGS) ×1 IMPLANT
DRAPE LAPAROSCOPIC ABDOMINAL (DRAPES) IMPLANT
DRAPE LAPAROTOMY 100X72 PEDS (DRAPES) ×1 IMPLANT
DRAPE UTILITY XL STRL (DRAPES) ×1 IMPLANT
ELECT COATED BLADE 2.86 ST (ELECTRODE) ×1 IMPLANT
ELECT REM PT RETURN 9FT ADLT (ELECTROSURGICAL) ×1
ELECTRODE REM PT RTRN 9FT ADLT (ELECTROSURGICAL) ×1 IMPLANT
GLOVE BIO SURGEON STRL SZ 6.5 (GLOVE) IMPLANT
GLOVE BIOGEL PI IND STRL 7.0 (GLOVE) IMPLANT
GLOVE BIOGEL PI IND STRL 7.5 (GLOVE) IMPLANT
GLOVE BIOGEL PI IND STRL 8 (GLOVE) ×1 IMPLANT
GLOVE ECLIPSE 8.0 STRL XLNG CF (GLOVE) ×1 IMPLANT
GOWN STRL REUS W/ TWL LRG LVL3 (GOWN DISPOSABLE) ×2 IMPLANT
GOWN STRL REUS W/ TWL XL LVL3 (GOWN DISPOSABLE) ×1 IMPLANT
GOWN STRL REUS W/TWL LRG LVL3 (GOWN DISPOSABLE) ×2
GOWN STRL REUS W/TWL XL LVL3 (GOWN DISPOSABLE) ×1
HEMOSTAT ARISTA ABSORB 3G PWDR (HEMOSTASIS) IMPLANT
HEMOSTAT SNOW SURGICEL 2X4 (HEMOSTASIS) IMPLANT
KIT MARKER MARGIN INK (KITS) ×1 IMPLANT
NDL HYPO 25X1 1.5 SAFETY (NEEDLE) ×1 IMPLANT
NEEDLE HYPO 25X1 1.5 SAFETY (NEEDLE) ×1
NS IRRIG 1000ML POUR BTL (IV SOLUTION) ×1 IMPLANT
PACK BASIN DAY SURGERY FS (CUSTOM PROCEDURE TRAY) ×1 IMPLANT
PENCIL SMOKE EVACUATOR (MISCELLANEOUS) ×1 IMPLANT
SLEEVE SCD COMPRESS KNEE MED (STOCKING) ×1 IMPLANT
SPIKE FLUID TRANSFER (MISCELLANEOUS) IMPLANT
SPONGE T-LAP 4X18 ~~LOC~~+RFID (SPONGE) ×1 IMPLANT
SUT MNCRL AB 4-0 PS2 18 (SUTURE) ×1 IMPLANT
SUT SILK 2 0 SH (SUTURE) IMPLANT
SUT VICRYL 3-0 CR8 SH (SUTURE) ×1 IMPLANT
SYR CONTROL 10ML LL (SYRINGE) ×1 IMPLANT
TOWEL GREEN STERILE FF (TOWEL DISPOSABLE) ×1 IMPLANT
TRAY FAXITRON CT DISP (TRAY / TRAY PROCEDURE) ×1 IMPLANT
TUBE CONNECTING 20X1/4 (TUBING) IMPLANT
YANKAUER SUCT BULB TIP NO VENT (SUCTIONS) IMPLANT

## 2023-05-22 NOTE — Anesthesia Procedure Notes (Signed)
Procedure Name: LMA Insertion Date/Time: 05/22/2023 3:04 PM  Performed by: Burna Cash, CRNAPre-anesthesia Checklist: Patient identified, Emergency Drugs available, Suction available and Patient being monitored Patient Re-evaluated:Patient Re-evaluated prior to induction Oxygen Delivery Method: Circle system utilized Preoxygenation: Pre-oxygenation with 100% oxygen Induction Type: IV induction Ventilation: Mask ventilation without difficulty LMA: LMA inserted LMA Size: 4.0 Number of attempts: 1 Airway Equipment and Method: Bite block Placement Confirmation: positive ETCO2 Tube secured with: Tape Dental Injury: Teeth and Oropharynx as per pre-operative assessment

## 2023-05-22 NOTE — Interval H&P Note (Signed)
History and Physical Interval Note:  05/22/2023 1:25 PM  Jill Adams  has presented today for surgery, with the diagnosis of LEFT BREAST CANCER.  The various methods of treatment have been discussed with the patient and family. After consideration of risks, benefits and other options for treatment, the patient has consented to  Procedure(s): LEFT BREAST LUMPECTOMY WITH RADIOACTIVE SEED LOCALIZATION (Left) as a surgical intervention.  The patient's history has been reviewed, patient examined, no change in status, stable for surgery.  I have reviewed the patient's chart and labs.  Questions were answered to the patient's satisfaction.     Erdine Hulen A Damien Cisar

## 2023-05-22 NOTE — Anesthesia Postprocedure Evaluation (Signed)
Anesthesia Post Note  Patient: Jill Adams  Procedure(s) Performed: LEFT BREAST LUMPECTOMY WITH RADIOACTIVE SEED LOCALIZATION (Left: Breast)     Patient location during evaluation: PACU Anesthesia Type: General Level of consciousness: awake Pain management: pain level controlled Vital Signs Assessment: post-procedure vital signs reviewed and stable Respiratory status: spontaneous breathing, nonlabored ventilation and respiratory function stable Cardiovascular status: blood pressure returned to baseline and stable Postop Assessment: no apparent nausea or vomiting Anesthetic complications: no   No notable events documented.  Last Vitals:  Vitals:   05/22/23 1613 05/22/23 1626  BP: 123/63 (!) 150/80  Pulse: 76 80  Resp: 17 16  Temp:  (!) 36.2 C  SpO2: 97% 95%    Last Pain:  Vitals:   05/22/23 1626  TempSrc:   PainSc: 0-No pain                 Deaven Urwin P Mikeisha Lemonds

## 2023-05-22 NOTE — Transfer of Care (Signed)
Immediate Anesthesia Transfer of Care Note  Patient: Jill Adams  Procedure(s) Performed: LEFT BREAST LUMPECTOMY WITH RADIOACTIVE SEED LOCALIZATION (Left: Breast)  Patient Location: PACU  Anesthesia Type:General  Level of Consciousness: awake, alert , and oriented  Airway & Oxygen Therapy: Patient Spontanous Breathing and Patient connected to face mask oxygen  Post-op Assessment: Report given to RN and Post -op Vital signs reviewed and stable  Post vital signs: Reviewed and stable  Last Vitals:  Vitals Value Taken Time  BP 159/79 05/22/23 1549  Temp    Pulse 83 05/22/23 1552  Resp 15 05/22/23 1552  SpO2 98 % 05/22/23 1552  Vitals shown include unfiled device data.  Last Pain:  Vitals:   05/22/23 1315  TempSrc: Temporal  PainSc: 0-No pain         Complications: No notable events documented.
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
## 2023-05-22 NOTE — Op Note (Signed)
Preoperative diagnosis: Stage I left breast cancer ER positive upper inner quadrant  Postoperative diagnosis: Same  Procedure: Left breast seed localized lumpectomy  Surgeon: Harriette Bouillon, MD  Anesthesia: LMA with 0.25% Marcaine with epinephrine  EBL: Minimal    Specimen: Left breast tissue seed and clip verified by Faxitron and additional deep margin  Drains: None  Indications for procedure: The patient is a 72 year old female with stage I left breast cancer.  She was seen in the multidisciplinary clinic and evaluated and shows breast conserving surgery.  Risks and benefits as well as long-term survival, local regional recurrence and quality of life were all reviewed with her.The procedure has been discussed with the patient. Alternatives to surgery have been discussed with the patient.  Risks of surgery include bleeding,  Infection,  Seroma formation, death,  and the need for further surgery.   Reviewed the pros and cons of sentinel lymph node mapping and the recent data discussing potential admission.  She agreed to not have a sentinel node removed.  The patient understands and wishes to proceed.   Description of procedure: The patient was met in the holding area and questions were answered.  The left breast was marked as the correct site and a seed was placed as an outpatient.  She was taken to the operating room.  She was placed supine upon the operating room table.  After induction of general anesthesia, left breast was prepped and draped in a sterile fashion and timeout performed.  Films were available for review.  Neoprobe was used to identify the seed in the left upper inner quadrant.  A transverse incision made with scalpel and dissection was carried down to her all the tissue and the seed and clip were excised with a grossly negative margin.  Local anesthetic was infiltrated throughout the cavity.  Hemostasis achieved with cautery.  The imaging revealed the seed and clip in the  specimen.  I did take a deep margin.  The remainder of the margins appeared grossly negative.  Irrigation was used.  Local anesthetic infiltrated.  The deep tissue planes were approximately 3-0 Vicryl.  4 Monocryl was used to close the skin in a subcuticular fashion.  Dermabond was applied.  All counts found to be correct.  Breast binder placed.  The patient was awoke extubated taken to recovery in satisfactory condition.

## 2023-05-22 NOTE — Discharge Instructions (Addendum)
[facility] Office Phone Number [phone]  BREAST BIOPSY/ PARTIAL MASTECTOMY: POST OP INSTRUCTIONS  Always review your discharge instruction sheet given to you by the facility where your surgery was performed.  IF YOU HAVE DISABILITY OR FAMILY LEAVE FORMS, YOU MUST BRING THEM TO THE OFFICE FOR PROCESSING.  DO NOT GIVE THEM TO YOUR DOCTOR.  A prescription for pain medication may be given to you upon discharge.  Take your pain medication as prescribed, if needed.  If narcotic pain medicine is not needed, then you may take acetaminophen (Tylenol) or ibuprofen (Advil) as needed. Take your usually prescribed medications unless otherwise directed If you need a refill on your pain medication, please contact your pharmacy.  They will contact our office to request authorization.  Prescriptions will not be filled after 5pm or on week-ends. You should eat very light the first 24 hours after surgery, such as soup, crackers, pudding, etc.  Resume your normal diet the day after surgery. Most patients will experience some swelling and bruising in the breast.  Ice packs and a good support bra will help.  Swelling and bruising can take several days to resolve.  It is common to experience some constipation if taking pain medication after surgery.  Increasing fluid intake and taking a stool softener will usually help or prevent this problem from occurring.  A mild laxative (Milk of Magnesia or Miralax) should be taken according to package directions if there are no bowel movements after 48 hours. Unless discharge instructions indicate otherwise, you may remove your bandages 24-48 hours after surgery, and you may shower at that time.  You may have steri-strips (small skin tapes) in place directly over the incision.  These strips should be left on the skin for 7-10 days.  If your surgeon used skin glue on the incision, you may shower in 24 hours.  The glue will flake off over the next 2-3 weeks.  Any  sutures or staples will be removed at the office during your follow-up visit. ACTIVITIES:  You may resume regular daily activities (gradually increasing) beginning the next day.  Wearing a good support bra or sports bra minimizes pain and swelling.  You may have sexual intercourse when it is comfortable. You may drive when you no longer are taking prescription pain medication, you can comfortably wear a seatbelt, and you can safely maneuver your car and apply brakes. RETURN TO WORK:  ______________________________________________________________________________________ Jill Adams should see your doctor in the office for a follow-up appointment approximately two weeks after your surgery.  Your doctor's nurse will typically make your follow-up appointment when she calls you with your pathology report.  Expect your pathology report 2-3 business days after your surgery.  You may call to check if you do not hear from [place] after three days. OTHER INSTRUCTIONS: _______________________________________________________________________________________________ _____________________________________________________________________________________________________________________________________ _____________________________________________________________________________________________________________________________________ _____________________________________________________________________________________________________________________________________  WHEN TO CALL YOUR DOCTOR: Fever over 101.0 Nausea and/or vomiting. Extreme swelling or bruising. Continued bleeding from incision. Increased pain, redness, or drainage from the incision.  The clinic staff is available to answer your questions during regular business hours.  Please don't hesitate to call and ask to speak to one of the nurses for clinical concerns.  If you have a medical emergency, go to the nearest emergency room or call 911.  A surgeon from [facility] is always on call at the hospital.  For further questions, please visit [email or web address]     Post Anesthesia Home Care Instructions  Activity: Get plenty of rest for the remainder  of the day. A responsible individual must stay with you for 24 hours following the procedure.  For the next 24 hours, DO NOT: -Drive a car -Advertising copywriter -Drink alcoholic beverages -Take any medication unless instructed by your physician -Make any legal decisions or sign important papers.  Meals: Start with liquid foods such as gelatin or soup. Progress to regular foods as tolerated. Avoid greasy, spicy, heavy foods. If nausea and/or vomiting occur, drink only clear liquids until the nausea and/or vomiting subsides. Call your physician if vomiting continues.  Special Instructions/Symptoms: Your throat may feel dry or sore from the anesthesia or the breathing tube placed in your throat during surgery. If this causes discomfort, gargle with warm salt water. The discomfort should disappear within 24 hours.  If you had a scopolamine patch placed behind your ear for the management of post- operative nausea and/or vomiting:  1. The medication in the patch is effective for 72 hours, after which it should be removed.  Wrap patch in a tissue and discard in the trash. Wash hands thoroughly with soap and water. 2. You may remove the patch earlier than 72 hours if you experience unpleasant side effects which may include dry mouth, dizziness or visual disturbances. 3. Avoid touching the patch. Wash your hands with soap and water after contact with the patch.     May have Tylenol today after 7:20 PM

## 2023-05-22 NOTE — Anesthesia Preprocedure Evaluation (Addendum)
Anesthesia Evaluation  Patient identified by MRN, date of birth, ID band Patient awake    Reviewed: Allergy & Precautions, NPO status , Patient's Chart, lab work & pertinent test results  Airway Mallampati: II  TM Distance: >3 FB Neck ROM: Full    Dental  (+) Missing   Pulmonary Current Smoker and Patient abstained from smoking.   Pulmonary exam normal        Cardiovascular negative cardio ROS Normal cardiovascular exam     Neuro/Psych  PSYCHIATRIC DISORDERS Anxiety Depression    negative neurological ROS     GI/Hepatic negative GI ROS, Neg liver ROS,,,  Endo/Other  Hypothyroidism    Renal/GU negative Renal ROS     Musculoskeletal Burn   Abdominal  (+) + obese  Peds  Hematology negative hematology ROS (+)   Anesthesia Other Findings LEFT BREAST CANCER  Reproductive/Obstetrics                             Anesthesia Physical Anesthesia Plan  ASA: 2  Anesthesia Plan: General   Post-op Pain Management:    Induction: Intravenous  PONV Risk Score and Plan: 2 and Ondansetron, Dexamethasone and Treatment may vary due to age or medical condition  Airway Management Planned: LMA  Additional Equipment:   Intra-op Plan:   Post-operative Plan: Extubation in OR  Informed Consent: I have reviewed the patients History and Physical, chart, labs and discussed the procedure including the risks, benefits and alternatives for the proposed anesthesia with the patient or authorized representative who has indicated his/her understanding and acceptance.     Dental advisory given  Plan Discussed with: CRNA  Anesthesia Plan Comments:        Anesthesia Quick Evaluation

## 2023-05-23 ENCOUNTER — Telehealth: Payer: Self-pay | Admitting: Genetic Counselor

## 2023-05-23 ENCOUNTER — Encounter: Payer: Self-pay | Admitting: Genetic Counselor

## 2023-05-23 DIAGNOSIS — Z1379 Encounter for other screening for genetic and chromosomal anomalies: Secondary | ICD-10-CM | POA: Insufficient documentation

## 2023-05-23 NOTE — Telephone Encounter (Signed)
I contacted Ms. Ragle to discuss her genetic testing results. No pathogenic variants were identified in the 34 genes analyzed. 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 Detailed clinic note to follow.  The test report has been scanned into EPIC and is located under the Molecular Pathology section of the Results Review tab.  A portion of the result report is included below for reference.   Lalla Brothers, MS, [facility] Genetic Counselor [email or web address] (P) [phone]

## 2023-05-26 ENCOUNTER — Telehealth: Payer: Self-pay

## 2023-05-26 ENCOUNTER — Ambulatory Visit: Payer: Self-pay | Admitting: Genetic Counselor

## 2023-05-26 ENCOUNTER — Encounter: Payer: Self-pay | Admitting: Genetic Counselor

## 2023-05-26 DIAGNOSIS — Z1379 Encounter for other screening for genetic and chromosomal anomalies: Secondary | ICD-10-CM

## 2023-05-26 NOTE — Progress Notes (Signed)
HPI:   Jill Adams was previously seen in the [facility] due to a personal and family history of cancer and concerns regarding a hereditary predisposition to cancer. Please refer to our prior cancer genetics clinic note for more information regarding our discussion, assessment and recommendations, at the time. Jill Adams recent genetic test results were disclosed to her, as were recommendations warranted by these results. These results and recommendations are discussed in more detail below.  CANCER HISTORY:  Oncology History  Malignant neoplasm of upper-inner quadrant of left breast in female, estrogen receptor positive (HCC)  04/07/2023 Mammogram   Screening mammogram showed possible mass in the left breast warranting further evaluation.  Diagnostic mammogram confirmed highly suspicious 5 mm mass involving the upper inner quadrant of the left breast at 1130 o'clock 6 cm from the nipple.  Ultrasound-guided core needle biopsy of the left breast mass was recommended.   04/28/2023 Pathology Results   Pathology from the left breast needle core biopsy upper inner quadrant 1130 o'clock 6 cm from the nipple showed invasive ductal carcinoma, overall grade 1, prognostic showed ER 100% positive strong staining, PR 40% positive strong staining intensity, Ki-67 of 1% and HER2 negative   05/05/2023 Initial Diagnosis   Malignant neoplasm of upper-inner quadrant of left breast in female, estrogen receptor positive (HCC)   05/07/2023 Cancer Staging   Staging form: Breast, AJCC 8th Edition - Clinical stage from 05/07/2023: Stage IA (cT1a, cN0, cM0, G1, ER+, PR+, HER2-) - Signed by Rachel Moulds, MD on 05/07/2023 Stage prefix: Initial diagnosis Histologic grading system: 3 grade system Laterality: Left Staged by: Pathologist and managing physician Stage used in treatment planning: Yes National guidelines used in treatment planning: Yes Type of national guideline used in treatment  planning: NCCN    Genetic Testing   [facility] CancerNext Panel+RNA was Negative. 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 Report date is 05/19/2023.   The CancerNext gene panel offered by [employer] includes sequencing, rearrangement analysis, and RNA analysis for the following 34 genes:   APC, ATM, AXIN2, BARD1, BMPR1A, BRCA1, BRCA2, BRIP1, CDH1, CDK4, CDKN2A, CHEK2, DICER1, HOXB13, EPCAM, GREM1, MLH1, MSH2, MSH3, MSH6, MUTYH, NF1, NTHL1, PALB2, PMS2, POLD1, POLE, PTEN, RAD51C, RAD51D, SMAD4, SMARCA4, STK11, and TP53.      FAMILY HISTORY:  We obtained a detailed, 4-generation family history.  Significant diagnoses are listed below:   Ms. Maiden mother was diagnosed with ovarian cancer at age 94, she died at age 69. Of note, she has limited information about her paternal family medical history. She is unaware of previous family history of genetic testing for hereditary cancer risks. There is no reported Ashkenazi Jewish ancestry.       GENETIC TEST RESULTS:  The [facility] CancerNext Panel found no pathogenic mutations.   The CancerNext gene panel offered by [employer] includes sequencing, rearrangement analysis, and RNA analysis for the following 34 genes:   APC, ATM, AXIN2, BARD1, BMPR1A, BRCA1, BRCA2, BRIP1, CDH1, CDK4, CDKN2A, CHEK2, DICER1, HOXB13, EPCAM, GREM1, MLH1, MSH2, MSH3, MSH6, MUTYH, NF1, NTHL1, PALB2, PMS2, POLD1, POLE, PTEN, RAD51C, RAD51D, SMAD4, SMARCA4, STK11, and TP53.   The test report has been scanned into EPIC and is located under the Molecular Pathology section of the Results Review tab.  A portion of the result report is included below for reference. Genetic testing reported out on 05/19/2023.       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  At this time, it is unknown if this variant is associated with an increased risk for cancer or if it is benign, but most  uncertain variants are reclassified to benign. It should not be used to make medical management decisions. With time, we suspect the laboratory will determine the significance of this variant, if any. If the laboratory reclassifies this variant, we will attempt to contact Jill Adams to discuss it further.   Even though a pathogenic variant was not identified, possible explanations for the cancer in the family may include: There may be no hereditary risk for cancer in the family. The cancers in Jill Adams and/or her family may be due to other genetic or environmental factors. There may be a gene mutation in one of these genes that current testing methods cannot detect, but that chance is small. There could be another gene that has not yet been discovered, or that we have not yet tested, that is responsible for the cancer diagnoses in the family.  It is also possible there is a hereditary cause for the cancer in the family that Ms. Carbo did not inherit.  Therefore, it is important to remain in touch with cancer genetics in the future so that we can continue to offer Jill Adams the most up to date genetic testing.   ADDITIONAL GENETIC TESTING:  We discussed with Jill Adams that her genetic testing was fairly extensive.  If there are genes identified to increase cancer risk that can be analyzed in the future, we would be happy to discuss and coordinate this testing at that time.     CANCER SCREENING RECOMMENDATIONS:  Jill Adams test result is considered negative (normal).  This means that we have not identified a hereditary cause for her personal and family history of cancer at this time.  An individual's cancer risk and medical management are not determined by genetic test results alone. Overall cancer risk assessment incorporates additional factors, including personal medical history, family history, and any available genetic information that may result in a personalized plan for cancer  prevention and surveillance. Therefore, it is recommended she continue to follow the cancer management and screening guidelines provided by her oncology and primary healthcare provider.  RECOMMENDATIONS FOR FAMILY MEMBERS:   Individuals in this family might be at some increased risk of developing cancer, over the general population risk, due to the family history of cancer. We recommend women in this family have a yearly mammogram beginning at age 61, or 61 years younger than the earliest onset of cancer, an annual clinical breast exam, and perform monthly breast self-exams.  Other members of the family may still carry a pathogenic variant in one of these genes that Ms. Navarro did not inherit. Based on the family history, we recommend her siblings have genetic counseling and testing.  We do not recommend familial testing for the MSH6 variant of uncertain significance (VUS).  FOLLOW-UP:  Cancer genetics is a rapidly advancing field and it is possible that new genetic tests will be appropriate for her and/or her family members in the future. We encouraged her to remain in contact with cancer genetics on an annual basis so we can update her personal and family histories and let her know of advances in cancer genetics that may benefit this family.   Our contact number was provided. Ms. Escoto's questions were answered to her satisfaction, and she knows she is welcome to call us at anytime with additional questions or concerns.  Lalla Brothers, MS, [facility] Genetic Counselor [email or web address] (P) [phone]

## 2023-05-26 NOTE — Telephone Encounter (Signed)
[facility] CSW Progress Note  Clinical Child psychotherapist contacted patient by phone to follow up post surgery. Patient reported to be healing from surgery well and reported that she's doing good.  Next follow up scheduled following post op appointment with medical provider.  Marguerita Merles, LCSWA Clinical Social Worker [facility]

## 2023-05-28 ENCOUNTER — Telehealth: Payer: Self-pay | Admitting: *Deleted

## 2023-05-28 ENCOUNTER — Encounter: Payer: Self-pay | Admitting: *Deleted

## 2023-05-28 ENCOUNTER — Encounter: Payer: Self-pay | Admitting: Surgery

## 2023-05-28 LAB — SURGICAL PATHOLOGY

## 2023-05-28 NOTE — Telephone Encounter (Signed)
Ordered oncotype per Dr. Iruku. Sent requisition to pathology and exact sciences. 

## 2023-06-09 ENCOUNTER — Ambulatory Visit (INDEPENDENT_AMBULATORY_CARE_PROVIDER_SITE_OTHER): Payer: PPO

## 2023-06-09 DIAGNOSIS — Z0000 Encounter for general adult medical examination without abnormal findings: Secondary | ICD-10-CM | POA: Diagnosis not present
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
## 2023-06-09 NOTE — Patient Instructions (Signed)
Jill Adams , Thank you for taking time to come for your Medicare Wellness Visit. I appreciate your ongoing commitment to your health goals. Please review the following plan we discussed and let me know if I can assist you in the future.   Referrals/Orders/Follow-Ups/Clinician Recommendations: none  This is a list of the screening recommended for you and due dates:  Health Maintenance  Topic Date Due   DTaP/Tdap/Td vaccine (1 - Tdap) Never done   Flu Shot  03/20/2023   Mammogram  04/06/2024   Medicare Annual Wellness Visit  06/08/2024   Colon Cancer Screening  09/07/2026   Pneumonia Vaccine  Completed   DEXA scan (bone density measurement)  Completed   Hepatitis C Screening  Completed   Zoster (Shingles) Vaccine  Completed   HPV Vaccine  Aged Out   COVID-19 Vaccine  Discontinued    Advanced directives: (Copy Requested) Please bring a copy of your health care power of attorney and living will to the office to be added to your chart at your convenience.  Next Medicare Annual Wellness Visit scheduled for next year: Yes  Insert Preventive Care attachment Insert FALL PREVENTION attachment if needed

## 2023-06-10 ENCOUNTER — Telehealth: Payer: Self-pay | Admitting: *Deleted

## 2023-06-10 NOTE — Telephone Encounter (Signed)
This RN returned call to pt per message left yesterday - obtained identified VM- message left requesting return call or pt can send inquiry via the portal in My Chart.

## 2023-06-13 ENCOUNTER — Encounter: Payer: Self-pay | Admitting: *Deleted

## 2023-06-13 DIAGNOSIS — C50212 Malignant neoplasm of upper-inner quadrant of left female breast: Secondary | ICD-10-CM | POA: Diagnosis not present

## 2023-06-13 DIAGNOSIS — Z170 Estrogen receptor positive status [ER+]: Secondary | ICD-10-CM | POA: Diagnosis not present

## 2023-06-16 ENCOUNTER — Inpatient Hospital Stay: Payer: PPO | Attending: Hematology and Oncology | Admitting: Hematology and Oncology

## 2023-06-16 ENCOUNTER — Encounter: Payer: Self-pay | Admitting: *Deleted

## 2023-06-16 VITALS — BP 136/75 | HR 86 | Temp 97.5°F | Resp 17 | Wt 188.4 lb

## 2023-06-16 DIAGNOSIS — Z170 Estrogen receptor positive status [ER+]: Secondary | ICD-10-CM | POA: Diagnosis not present

## 2023-06-16 DIAGNOSIS — C50212 Malignant neoplasm of upper-inner quadrant of left female breast: Secondary | ICD-10-CM | POA: Diagnosis not present

## 2023-06-16 DIAGNOSIS — Z79899 Other long term (current) drug therapy: Secondary | ICD-10-CM | POA: Insufficient documentation

## 2023-06-16 NOTE — Progress Notes (Signed)
Location of Breast Cancer:left ***  Histology per Pathology Report: 05/22/23   Receptor Status: ER(100), PR (40), Her2-neu   Did patient present with symptoms (if so, please note symptoms) or was this found on screening mammography?: ***  Past/Anticipated interventions by surgeon, if ZOX:WRUE breast lumpectomy Dr. Luisa Hart  Past/Anticipated interventions by medical oncology, if any: Chemotherapy ***  Lymphedema issues, if any:  {:18581} {t:21944}   Pain issues, if any:  {:18581} {PAIN DESCRIPTION:[id number]}  SAFETY ISSUES: Prior radiation? {:18581} Pacemaker/ICD? {:18581} Possible current pregnancy?{:18581} Is the patient on methotrexate? {:18581}  Current Complaints / other details:  ***
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
## 2023-06-16 NOTE — Assessment & Plan Note (Signed)
This is a pleasant 72 year old postmenopausal female patient with screen detected left breast mass, further investigation showed 5 mm mass in the left breast upper inner quadrant 6 cm from the nipple.  Pathology from the left breast biopsy confirmed grade 1 IDC, prognostic showed ER 100% positive strong staining, PR 40% positive strong staining, Ki-67 1% and group 5 HER2 negative.  She is now status post lumpectomy, final pathology showed 1.1 cm IDC, negative margins, no lymph node specimen submitted.  Oncotype DX resulted at 15, no definitive evidence of added chemotherapy.  Risk of recurrence at 9 years outside the breast in node-negative setting comes to around 3%. She is very satisfied with Oncotype DX results.  I have recommended that she proceed with adjuvant radiation and return to clinic for follow-up to initiate antiestrogen therapy.  We have discussed about aromatase inhibitors once again today, mechanical fraction, adverse effects including but not limited to postmenopausal symptoms and bone density loss.  She has a bone density scan coming up in February.  She will return to clinic in mid December to start anastrozole.  All her questions were answered to the best my knowledge.  Thank you for consulting [place] in the care of this patient.  Please do not hesitate to contact us with any additional questions or concerns.

## 2023-06-17 NOTE — Progress Notes (Signed)
Radiation Oncology         [phone] ________________________________  Name: Jill Adams MRN: [id number]  Date: 06/18/2023  DOB: 16-Jun-1951  Re-Evaluation Note  CC: Loyola Mast, MD  Rachel Moulds, MD  No diagnosis found.  Diagnosis:  Stage IA (cT1a, cN0, cM0) Left Breast UIQ, Invasive and in situ ductal carcinoma, ER+ / PR+ / Her2-, Grade 1   Narrative:  The patient returns today to discuss radiation treatment options. She was seen in the multidisciplinary breast clinic on 05/07/23.   She underwent genetic testing on her consultation date. Results showed no clinically significant variants detected by BRCAplus or +RNAinsight testing. However, a variant of unknown significance was detected in the MSH6 gene.   She opted to proceed with a left breast lumpectomy without nodal biopsies on 05/22/23. Pathology from the procedure revealed: tumor the size of 1.1 cm; histology of grade 1 invasive ductal carcinoma with intermediate grade DCIS; all margins negative for carcinoma. Prognostic indicators significant for: estrogen receptor 100% positive and progesterone receptor 40% positive, both with strong staining intensity; Proliferation marker Ki67 at 1%; Her2 status negative; Grade 1.   Oncotype DX was obtained on the final surgical sample and the recurrence score of 15 predicts a risk of recurrence outside the breast over the next 9 years of 3%, if the patient's only systemic therapy is an antiestrogen for 5 years. It also predicts no significant benefit from chemotherapy.  Based on Oncotype results, she will not require chemotherapy and she will return to Dr. Al Pimple following XRT to initiate antiestrogen therapy. She is leaning towards taking anastrozole at this time.   On review of systems, the patient reports ***. She denies *** and any other symptoms.    Allergies:  has No Known Allergies.  Meds: Current Outpatient Medications  Medication Sig Dispense Refill   atorvastatin  (LIPITOR) 40 MG tablet TAKE ONE AND A HALF TABLETS BY MOUTH EVERY DAY 135 tablet 2   cholecalciferol (VITAMIN D3) 25 MCG (1000 UNIT) tablet Take 1,000 Units by mouth daily.     diphenhydrAMINE (BENADRYL) 25 MG tablet Take 25 mg by mouth every 6 (six) hours as needed.     diphenhydramine-acetaminophen (TYLENOL PM) 25-500 MG TABS tablet Take 1 tablet by mouth at bedtime as needed.     escitalopram (LEXAPRO) 20 MG tablet Take 1 tablet (20 mg total) by mouth daily. 90 tablet 3   fluticasone (FLONASE) 50 MCG/ACT nasal spray INHALE 2 SPRAYS IN EACH NOSTRIL DAILY 16 g 6   levothyroxine (SYNTHROID) 75 MCG tablet TAKE 1 TABLET BY MOUTH DAILY BEFORE BREAKFAST 90 tablet 3   Multiple Vitamin (MULTIVITAMIN) capsule Take 1 capsule by mouth daily.     naproxen (NAPROSYN) 500 MG tablet Take 1 tablet (500 mg total) by mouth 2 (two) times daily with a meal. 30 tablet 0   oxyCODONE (OXY IR/ROXICODONE) 5 MG immediate release tablet Take 1 tablet (5 mg total) by mouth every 6 (six) hours as needed for severe pain. 15 tablet 0   No current facility-administered medications for this encounter.    Physical Findings: The patient is in no acute distress. Patient is alert and oriented.  vitals were not taken for this visit.  No significant changes. Lungs are clear to auscultation bilaterally. Heart has regular rate and rhythm. No palpable cervical, supraclavicular, or axillary adenopathy. Abdomen soft, non-tender, normal bowel sounds. *** Breast: no palpable mass, nipple discharge or bleeding. *** Breast: ***  Lab Findings: Lab Results  Component  Value Date   WBC 6.7 05/07/2023   HGB 14.0 05/07/2023   HCT 40.5 05/07/2023   MCV 98.5 05/07/2023   PLT 416 (H) 05/07/2023    Radiographic Findings: MM Breast Surgical Specimen  Result Date: 05/22/2023 CLINICAL DATA:  Post left lumpectomy. EXAM: SPECIMEN RADIOGRAPH OF THE LEFT BREAST COMPARISON:  Previous exam(s). FINDINGS: Status post excision of the left breast. The  radioactive seed and biopsy marker clip are present, completely intact, and were marked for pathology. IMPRESSION: Specimen radiograph of the left breast. Electronically Signed   By: Edwin Cap M.D.   On: 05/22/2023 15:33   MM LT RADIOACTIVE SEED LOC MAMMO GUIDE  Result Date: 05/21/2023 CLINICAL DATA:  72 year old with biopsy-proven invasive ductal carcinoma and DCIS involving the UPPER INNER QUADRANT of the LEFT breast. Radioactive seed localization is performed in anticipation of lumpectomy which is scheduled for tomorrow. EXAM: MAMMOGRAPHIC GUIDED RADIOACTIVE SEED LOCALIZATION OF THE LEFT BREAST COMPARISON:  Previous exam(s). FINDINGS: Patient presents for radioactive seed localization prior to LEFT breast lumpectomy. I met with the patient and we discussed the procedure of seed localization including benefits and alternatives. We discussed the high likelihood of a successful procedure. We discussed the risks of the procedure including infection, bleeding, tissue injury and further surgery. We discussed the low dose of radioactivity involved in the procedure. Informed, written consent was given. The usual time-out protocol was performed immediately prior to the procedure. Using mammographic guidance, sterile technique with chlorhexidine as skin antisepsis, 1% lidocaine as local anesthetic, an I-125 radioactive seed was used to localize the ribbon shaped tissue marking clip associated with the biopsy-proven malignancy in the UPPER INNER QUADRANT using a medial approach. The follow-up mammogram images confirm that the seed is appropriately positioned immediately adjacent to the mass and ribbon clip. The images are marked for Dr. Luisa Hart. Follow-up survey of the patient confirms the presence of the radioactive seed. Order number of I-125 seed: [id number] Total activity: 0.243 mCi Reference Date: 04/23/2023 The patient tolerated the procedure well and was released from the [facility]. She was given  instructions regarding seed removal. IMPRESSION: Radioactive seed localization of biopsy-proven IDC and DCIS involving the UPPER INNER QUADRANT of the LEFT breast. Electronically Signed   By: Hulan Saas M.D.   On: 05/21/2023 13:30    Impression:  Stage IA (cT1a, cN0, cM0) Left Breast UIQ, Invasive and in situ ductal carcinoma, ER+ / PR+ / Her2-, Grade 1   ***  Plan:  Patient is scheduled for CT simulation {date/later today}. ***  -----------------------------------  Billie Lade, PhD, MD  This document serves as a record of services personally performed by Antony Blackbird, MD. It was created on his behalf by Neena Rhymes, a trained medical scribe. The creation of this record is based on the scribe's personal observations and the provider's statements to them. This document has been checked and approved by the attending provider.

## 2023-06-18 ENCOUNTER — Ambulatory Visit
Admission: RE | Admit: 2023-06-18 | Discharge: 2023-06-18 | Disposition: A | Discharge status: Home / Self Care | Payer: PPO | Source: Ambulatory Visit | Attending: Radiation Oncology | Admitting: Radiation Oncology

## 2023-06-18 ENCOUNTER — Encounter: Payer: Self-pay | Admitting: Radiation Oncology

## 2023-06-18 VITALS — BP 144/88 | HR 94 | Temp 97.1°F | Resp 18 | Ht 65.0 in | Wt 184.5 lb

## 2023-06-18 DIAGNOSIS — Z170 Estrogen receptor positive status [ER+]: Secondary | ICD-10-CM | POA: Insufficient documentation

## 2023-06-18 DIAGNOSIS — Z79890 Hormone replacement therapy (postmenopausal): Secondary | ICD-10-CM | POA: Insufficient documentation

## 2023-06-18 DIAGNOSIS — Z79899 Other long term (current) drug therapy: Secondary | ICD-10-CM | POA: Diagnosis not present

## 2023-06-18 DIAGNOSIS — C50212 Malignant neoplasm of upper-inner quadrant of left female breast: Secondary | ICD-10-CM | POA: Insufficient documentation

## 2023-06-18 DIAGNOSIS — Z79811 Long term (current) use of aromatase inhibitors: Secondary | ICD-10-CM | POA: Insufficient documentation

## 2023-06-23 DIAGNOSIS — F4321 Adjustment disorder with depressed mood: Secondary | ICD-10-CM | POA: Diagnosis not present

## 2023-06-25 ENCOUNTER — Encounter: Payer: Self-pay | Admitting: *Deleted

## 2023-06-25 DIAGNOSIS — C50212 Malignant neoplasm of upper-inner quadrant of left female breast: Secondary | ICD-10-CM

## 2023-06-27 ENCOUNTER — Encounter: Payer: Self-pay | Admitting: Family Medicine

## 2023-06-30 ENCOUNTER — Ambulatory Visit
Admission: RE | Admit: 2023-06-30 | Discharge: 2023-06-30 | Disposition: A | Discharge status: Home / Self Care | Payer: PPO | Source: Ambulatory Visit | Attending: Radiation Oncology | Admitting: Radiation Oncology

## 2023-06-30 DIAGNOSIS — Z170 Estrogen receptor positive status [ER+]: Secondary | ICD-10-CM | POA: Insufficient documentation

## 2023-06-30 DIAGNOSIS — C50212 Malignant neoplasm of upper-inner quadrant of left female breast: Secondary | ICD-10-CM | POA: Insufficient documentation

## 2023-06-30 DIAGNOSIS — Z510 Encounter for antineoplastic radiation therapy: Secondary | ICD-10-CM | POA: Diagnosis not present

## 2023-07-02 ENCOUNTER — Other Ambulatory Visit: Payer: Self-pay

## 2023-07-02 DIAGNOSIS — C50212 Malignant neoplasm of upper-inner quadrant of left female breast: Secondary | ICD-10-CM | POA: Diagnosis not present

## 2023-07-02 DIAGNOSIS — Z510 Encounter for antineoplastic radiation therapy: Secondary | ICD-10-CM | POA: Diagnosis not present

## 2023-07-02 DIAGNOSIS — Z170 Estrogen receptor positive status [ER+]: Secondary | ICD-10-CM | POA: Diagnosis not present

## 2023-07-02 LAB — RAD ONC ARIA SESSION SUMMARY
Course Elapsed Days: 0
Plan Fractions Treated to Date: 1
Plan Prescribed Dose Per Fraction: 2.67 Gy
Plan Total Fractions Prescribed: 16
Plan Total Prescribed Dose: 42.72 Gy
Reference Point Dosage Given to Date: 2.67 Gy
Reference Point Session Dosage Given: 2.67 Gy
Session Number: 1

## 2023-07-03 ENCOUNTER — Ambulatory Visit
Admission: RE | Admit: 2023-07-03 | Discharge: 2023-07-03 | Disposition: A | Discharge status: Home / Self Care | Payer: PPO | Source: Ambulatory Visit | Attending: Radiation Oncology | Admitting: Radiation Oncology

## 2023-07-03 ENCOUNTER — Other Ambulatory Visit: Payer: Self-pay

## 2023-07-03 DIAGNOSIS — C50212 Malignant neoplasm of upper-inner quadrant of left female breast: Secondary | ICD-10-CM | POA: Diagnosis not present

## 2023-07-03 DIAGNOSIS — Z510 Encounter for antineoplastic radiation therapy: Secondary | ICD-10-CM | POA: Diagnosis not present

## 2023-07-03 DIAGNOSIS — Z170 Estrogen receptor positive status [ER+]: Secondary | ICD-10-CM | POA: Diagnosis not present

## 2023-07-03 LAB — RAD ONC ARIA SESSION SUMMARY
Course Elapsed Days: 1
Plan Fractions Treated to Date: 2
Plan Prescribed Dose Per Fraction: 2.67 Gy
Plan Total Fractions Prescribed: 16
Plan Total Prescribed Dose: 42.72 Gy
Reference Point Dosage Given to Date: 5.34 Gy
Reference Point Session Dosage Given: 2.67 Gy
Session Number: 2

## 2023-07-04 ENCOUNTER — Ambulatory Visit
Admission: RE | Admit: 2023-07-04 | Discharge: 2023-07-04 | Disposition: A | Discharge status: Home / Self Care | Payer: PPO | Source: Ambulatory Visit | Attending: Radiation Oncology | Admitting: Radiation Oncology

## 2023-07-04 ENCOUNTER — Other Ambulatory Visit: Payer: Self-pay

## 2023-07-04 DIAGNOSIS — C50212 Malignant neoplasm of upper-inner quadrant of left female breast: Secondary | ICD-10-CM | POA: Diagnosis not present

## 2023-07-04 DIAGNOSIS — Z170 Estrogen receptor positive status [ER+]: Secondary | ICD-10-CM | POA: Diagnosis not present

## 2023-07-04 DIAGNOSIS — Z510 Encounter for antineoplastic radiation therapy: Secondary | ICD-10-CM | POA: Diagnosis not present

## 2023-07-04 LAB — RAD ONC ARIA SESSION SUMMARY
Course Elapsed Days: 2
Plan Fractions Treated to Date: 3
Plan Prescribed Dose Per Fraction: 2.67 Gy
Plan Total Fractions Prescribed: 16
Plan Total Prescribed Dose: 42.72 Gy
Reference Point Dosage Given to Date: 8.01 Gy
Reference Point Session Dosage Given: 2.67 Gy
Session Number: 3

## 2023-07-06 ENCOUNTER — Encounter: Payer: Self-pay | Admitting: Family Medicine

## 2023-07-07 ENCOUNTER — Other Ambulatory Visit: Payer: Self-pay

## 2023-07-07 ENCOUNTER — Ambulatory Visit
Admission: RE | Admit: 2023-07-07 | Discharge: 2023-07-07 | Disposition: A | Discharge status: Home / Self Care | Payer: PPO | Source: Ambulatory Visit | Attending: Radiation Oncology | Admitting: Radiation Oncology

## 2023-07-07 DIAGNOSIS — Z170 Estrogen receptor positive status [ER+]: Secondary | ICD-10-CM

## 2023-07-07 DIAGNOSIS — Z510 Encounter for antineoplastic radiation therapy: Secondary | ICD-10-CM | POA: Diagnosis not present

## 2023-07-07 DIAGNOSIS — C50212 Malignant neoplasm of upper-inner quadrant of left female breast: Secondary | ICD-10-CM | POA: Diagnosis not present

## 2023-07-07 LAB — RAD ONC ARIA SESSION SUMMARY
Course Elapsed Days: 5
Plan Fractions Treated to Date: 4
Plan Prescribed Dose Per Fraction: 2.67 Gy
Plan Total Fractions Prescribed: 16
Plan Total Prescribed Dose: 42.72 Gy
Reference Point Dosage Given to Date: 10.68 Gy
Reference Point Session Dosage Given: 2.67 Gy
Session Number: 4

## 2023-07-07 MED ORDER — RADIAPLEXRX EX GEL: Freq: Once | CUTANEOUS | Status: AC

## 2023-07-07 MED ORDER — ALRA NON-METALLIC DEODORANT (RAD-ONC)
1.0000 | Freq: Once | TOPICAL | Status: AC
  Administered 2023-07-07: 1 via TOPICAL

## 2023-07-08 ENCOUNTER — Ambulatory Visit
Admission: RE | Admit: 2023-07-08 | Discharge: 2023-07-08 | Disposition: A | Discharge status: Home / Self Care | Payer: PPO | Source: Ambulatory Visit | Attending: Radiation Oncology | Admitting: Radiation Oncology

## 2023-07-08 ENCOUNTER — Other Ambulatory Visit: Payer: Self-pay

## 2023-07-08 DIAGNOSIS — Z510 Encounter for antineoplastic radiation therapy: Secondary | ICD-10-CM | POA: Diagnosis not present

## 2023-07-08 DIAGNOSIS — C50212 Malignant neoplasm of upper-inner quadrant of left female breast: Secondary | ICD-10-CM | POA: Diagnosis not present

## 2023-07-08 DIAGNOSIS — Z170 Estrogen receptor positive status [ER+]: Secondary | ICD-10-CM | POA: Diagnosis not present

## 2023-07-08 LAB — RAD ONC ARIA SESSION SUMMARY
Course Elapsed Days: 6
Plan Fractions Treated to Date: 5
Plan Prescribed Dose Per Fraction: 2.67 Gy
Plan Total Fractions Prescribed: 16
Plan Total Prescribed Dose: 42.72 Gy
Reference Point Dosage Given to Date: 13.35 Gy
Reference Point Session Dosage Given: 2.67 Gy
Session Number: 5

## 2023-07-09 ENCOUNTER — Other Ambulatory Visit: Payer: Self-pay

## 2023-07-09 ENCOUNTER — Ambulatory Visit
Admission: RE | Admit: 2023-07-09 | Discharge: 2023-07-09 | Disposition: A | Discharge status: Home / Self Care | Payer: PPO | Source: Ambulatory Visit | Attending: Radiation Oncology | Admitting: Radiation Oncology

## 2023-07-09 DIAGNOSIS — Z510 Encounter for antineoplastic radiation therapy: Secondary | ICD-10-CM | POA: Diagnosis not present

## 2023-07-09 DIAGNOSIS — C50212 Malignant neoplasm of upper-inner quadrant of left female breast: Secondary | ICD-10-CM | POA: Diagnosis not present

## 2023-07-09 DIAGNOSIS — Z170 Estrogen receptor positive status [ER+]: Secondary | ICD-10-CM | POA: Diagnosis not present

## 2023-07-09 LAB — RAD ONC ARIA SESSION SUMMARY
Course Elapsed Days: 7
Plan Fractions Treated to Date: 6
Plan Prescribed Dose Per Fraction: 2.67 Gy
Plan Total Fractions Prescribed: 16
Plan Total Prescribed Dose: 42.72 Gy
Reference Point Dosage Given to Date: 16.02 Gy
Reference Point Session Dosage Given: 2.67 Gy
Session Number: 6

## 2023-07-10 ENCOUNTER — Ambulatory Visit
Admission: RE | Admit: 2023-07-10 | Discharge: 2023-07-10 | Disposition: A | Discharge status: Home / Self Care | Payer: PPO | Source: Ambulatory Visit | Attending: Radiation Oncology | Admitting: Radiation Oncology

## 2023-07-10 ENCOUNTER — Other Ambulatory Visit: Payer: Self-pay

## 2023-07-10 DIAGNOSIS — C50212 Malignant neoplasm of upper-inner quadrant of left female breast: Secondary | ICD-10-CM | POA: Diagnosis not present

## 2023-07-10 DIAGNOSIS — Z510 Encounter for antineoplastic radiation therapy: Secondary | ICD-10-CM | POA: Diagnosis not present

## 2023-07-10 DIAGNOSIS — Z170 Estrogen receptor positive status [ER+]: Secondary | ICD-10-CM | POA: Diagnosis not present

## 2023-07-10 LAB — RAD ONC ARIA SESSION SUMMARY
Course Elapsed Days: 8
Plan Fractions Treated to Date: 7
Plan Prescribed Dose Per Fraction: 2.67 Gy
Plan Total Fractions Prescribed: 16
Plan Total Prescribed Dose: 42.72 Gy
Reference Point Dosage Given to Date: 18.69 Gy
Reference Point Session Dosage Given: 2.67 Gy
Session Number: 7

## 2023-07-11 ENCOUNTER — Other Ambulatory Visit: Payer: Self-pay

## 2023-07-11 ENCOUNTER — Ambulatory Visit
Admission: RE | Admit: 2023-07-11 | Discharge: 2023-07-11 | Disposition: A | Discharge status: Home / Self Care | Payer: PPO | Source: Ambulatory Visit | Attending: Radiation Oncology | Admitting: Radiation Oncology

## 2023-07-11 DIAGNOSIS — Z510 Encounter for antineoplastic radiation therapy: Secondary | ICD-10-CM | POA: Diagnosis not present

## 2023-07-11 DIAGNOSIS — C50212 Malignant neoplasm of upper-inner quadrant of left female breast: Secondary | ICD-10-CM | POA: Diagnosis not present

## 2023-07-11 DIAGNOSIS — Z170 Estrogen receptor positive status [ER+]: Secondary | ICD-10-CM | POA: Diagnosis not present

## 2023-07-11 LAB — RAD ONC ARIA SESSION SUMMARY
Course Elapsed Days: 9
Plan Fractions Treated to Date: 8
Plan Prescribed Dose Per Fraction: 2.67 Gy
Plan Total Fractions Prescribed: 16
Plan Total Prescribed Dose: 42.72 Gy
Reference Point Dosage Given to Date: 21.36 Gy
Reference Point Session Dosage Given: 2.67 Gy
Session Number: 8

## 2023-07-13 ENCOUNTER — Other Ambulatory Visit: Payer: Self-pay

## 2023-07-13 ENCOUNTER — Ambulatory Visit
Admission: RE | Admit: 2023-07-13 | Discharge: 2023-07-13 | Disposition: A | Discharge status: Home / Self Care | Payer: PPO | Source: Ambulatory Visit | Attending: Radiation Oncology | Admitting: Radiation Oncology

## 2023-07-13 DIAGNOSIS — Z510 Encounter for antineoplastic radiation therapy: Secondary | ICD-10-CM | POA: Diagnosis not present

## 2023-07-13 DIAGNOSIS — C50212 Malignant neoplasm of upper-inner quadrant of left female breast: Secondary | ICD-10-CM | POA: Diagnosis not present

## 2023-07-13 DIAGNOSIS — Z170 Estrogen receptor positive status [ER+]: Secondary | ICD-10-CM | POA: Diagnosis not present

## 2023-07-13 LAB — RAD ONC ARIA SESSION SUMMARY
Course Elapsed Days: 11
Plan Fractions Treated to Date: 9
Plan Prescribed Dose Per Fraction: 2.67 Gy
Plan Total Fractions Prescribed: 16
Plan Total Prescribed Dose: 42.72 Gy
Reference Point Dosage Given to Date: 24.03 Gy
Reference Point Session Dosage Given: 2.67 Gy
Session Number: 9

## 2023-07-14 ENCOUNTER — Ambulatory Visit
Admission: RE | Admit: 2023-07-14 | Discharge: 2023-07-14 | Disposition: A | Discharge status: Home / Self Care | Payer: PPO | Source: Ambulatory Visit | Attending: Radiation Oncology | Admitting: Radiation Oncology

## 2023-07-14 ENCOUNTER — Other Ambulatory Visit: Payer: Self-pay

## 2023-07-14 DIAGNOSIS — C50212 Malignant neoplasm of upper-inner quadrant of left female breast: Secondary | ICD-10-CM | POA: Diagnosis not present

## 2023-07-14 DIAGNOSIS — Z510 Encounter for antineoplastic radiation therapy: Secondary | ICD-10-CM | POA: Diagnosis not present

## 2023-07-14 DIAGNOSIS — Z170 Estrogen receptor positive status [ER+]: Secondary | ICD-10-CM | POA: Diagnosis not present

## 2023-07-14 LAB — RAD ONC ARIA SESSION SUMMARY
Course Elapsed Days: 12
Plan Fractions Treated to Date: 10
Plan Prescribed Dose Per Fraction: 2.67 Gy
Plan Total Fractions Prescribed: 16
Plan Total Prescribed Dose: 42.72 Gy
Reference Point Dosage Given to Date: 26.7 Gy
Reference Point Session Dosage Given: 2.67 Gy
Session Number: 10

## 2023-07-15 ENCOUNTER — Ambulatory Visit
Admission: RE | Admit: 2023-07-15 | Discharge: 2023-07-15 | Disposition: A | Discharge status: Home / Self Care | Payer: PPO | Source: Ambulatory Visit | Attending: Radiation Oncology | Admitting: Radiation Oncology

## 2023-07-15 ENCOUNTER — Other Ambulatory Visit: Payer: Self-pay

## 2023-07-15 DIAGNOSIS — Z510 Encounter for antineoplastic radiation therapy: Secondary | ICD-10-CM | POA: Diagnosis not present

## 2023-07-15 DIAGNOSIS — Z170 Estrogen receptor positive status [ER+]: Secondary | ICD-10-CM | POA: Diagnosis not present

## 2023-07-15 DIAGNOSIS — C50212 Malignant neoplasm of upper-inner quadrant of left female breast: Secondary | ICD-10-CM | POA: Diagnosis not present

## 2023-07-15 LAB — RAD ONC ARIA SESSION SUMMARY
Course Elapsed Days: 13
Plan Fractions Treated to Date: 11
Plan Prescribed Dose Per Fraction: 2.67 Gy
Plan Total Fractions Prescribed: 16
Plan Total Prescribed Dose: 42.72 Gy
Reference Point Dosage Given to Date: 29.37 Gy
Reference Point Session Dosage Given: 2.67 Gy
Session Number: 11

## 2023-07-16 ENCOUNTER — Ambulatory Visit
Admission: RE | Admit: 2023-07-16 | Discharge: 2023-07-16 | Disposition: A | Discharge status: Home / Self Care | Payer: PPO | Source: Ambulatory Visit | Attending: Radiation Oncology | Admitting: Radiation Oncology

## 2023-07-16 ENCOUNTER — Other Ambulatory Visit: Payer: Self-pay

## 2023-07-16 DIAGNOSIS — C50212 Malignant neoplasm of upper-inner quadrant of left female breast: Secondary | ICD-10-CM | POA: Diagnosis not present

## 2023-07-16 DIAGNOSIS — Z510 Encounter for antineoplastic radiation therapy: Secondary | ICD-10-CM | POA: Diagnosis not present

## 2023-07-16 DIAGNOSIS — Z170 Estrogen receptor positive status [ER+]: Secondary | ICD-10-CM | POA: Diagnosis not present

## 2023-07-16 LAB — RAD ONC ARIA SESSION SUMMARY
Course Elapsed Days: 14
Plan Fractions Treated to Date: 12
Plan Prescribed Dose Per Fraction: 2.67 Gy
Plan Total Fractions Prescribed: 16
Plan Total Prescribed Dose: 42.72 Gy
Reference Point Dosage Given to Date: 32.04 Gy
Reference Point Session Dosage Given: 2.67 Gy
Session Number: 12

## 2023-07-21 ENCOUNTER — Other Ambulatory Visit: Payer: Self-pay

## 2023-07-21 ENCOUNTER — Ambulatory Visit
Admission: RE | Admit: 2023-07-21 | Discharge: 2023-07-21 | Disposition: A | Discharge status: Home / Self Care | Payer: PPO | Source: Ambulatory Visit | Attending: Radiation Oncology | Admitting: Radiation Oncology

## 2023-07-21 DIAGNOSIS — Z170 Estrogen receptor positive status [ER+]: Secondary | ICD-10-CM | POA: Insufficient documentation

## 2023-07-21 DIAGNOSIS — Z510 Encounter for antineoplastic radiation therapy: Secondary | ICD-10-CM | POA: Insufficient documentation

## 2023-07-21 DIAGNOSIS — C50212 Malignant neoplasm of upper-inner quadrant of left female breast: Secondary | ICD-10-CM | POA: Diagnosis not present

## 2023-07-21 LAB — RAD ONC ARIA SESSION SUMMARY
Course Elapsed Days: 19
Plan Fractions Treated to Date: 13
Plan Prescribed Dose Per Fraction: 2.67 Gy
Plan Total Fractions Prescribed: 16
Plan Total Prescribed Dose: 42.72 Gy
Reference Point Dosage Given to Date: 34.71 Gy
Reference Point Session Dosage Given: 2.67 Gy
Session Number: 13

## 2023-07-22 ENCOUNTER — Ambulatory Visit
Admission: RE | Admit: 2023-07-22 | Discharge: 2023-07-22 | Disposition: A | Discharge status: Home / Self Care | Payer: PPO | Source: Ambulatory Visit | Attending: Radiation Oncology | Admitting: Radiation Oncology

## 2023-07-22 ENCOUNTER — Other Ambulatory Visit: Payer: Self-pay

## 2023-07-22 DIAGNOSIS — Z170 Estrogen receptor positive status [ER+]: Secondary | ICD-10-CM | POA: Diagnosis not present

## 2023-07-22 DIAGNOSIS — Z510 Encounter for antineoplastic radiation therapy: Secondary | ICD-10-CM | POA: Diagnosis not present

## 2023-07-22 DIAGNOSIS — C50212 Malignant neoplasm of upper-inner quadrant of left female breast: Secondary | ICD-10-CM | POA: Diagnosis not present

## 2023-07-22 LAB — RAD ONC ARIA SESSION SUMMARY
Course Elapsed Days: 20
Plan Fractions Treated to Date: 14
Plan Prescribed Dose Per Fraction: 2.67 Gy
Plan Total Fractions Prescribed: 16
Plan Total Prescribed Dose: 42.72 Gy
Reference Point Dosage Given to Date: 37.38 Gy
Reference Point Session Dosage Given: 2.67 Gy
Session Number: 14

## 2023-07-23 ENCOUNTER — Ambulatory Visit
Admission: RE | Admit: 2023-07-23 | Discharge: 2023-07-23 | Disposition: A | Discharge status: Home / Self Care | Payer: PPO | Source: Ambulatory Visit | Attending: Radiation Oncology | Admitting: Radiation Oncology

## 2023-07-23 ENCOUNTER — Other Ambulatory Visit: Payer: Self-pay

## 2023-07-23 DIAGNOSIS — Z510 Encounter for antineoplastic radiation therapy: Secondary | ICD-10-CM | POA: Diagnosis not present

## 2023-07-23 DIAGNOSIS — Z170 Estrogen receptor positive status [ER+]: Secondary | ICD-10-CM | POA: Diagnosis not present

## 2023-07-23 DIAGNOSIS — C50212 Malignant neoplasm of upper-inner quadrant of left female breast: Secondary | ICD-10-CM | POA: Diagnosis not present

## 2023-07-23 LAB — RAD ONC ARIA SESSION SUMMARY
Course Elapsed Days: 21
Plan Fractions Treated to Date: 15
Plan Prescribed Dose Per Fraction: 2.67 Gy
Plan Total Fractions Prescribed: 16
Plan Total Prescribed Dose: 42.72 Gy
Reference Point Dosage Given to Date: 40.05 Gy
Reference Point Session Dosage Given: 2.67 Gy
Session Number: 15

## 2023-07-24 ENCOUNTER — Other Ambulatory Visit: Payer: Self-pay

## 2023-07-24 ENCOUNTER — Ambulatory Visit: Payer: PPO

## 2023-07-24 ENCOUNTER — Ambulatory Visit
Admission: RE | Admit: 2023-07-24 | Discharge: 2023-07-24 | Disposition: A | Discharge status: Home / Self Care | Payer: PPO | Source: Ambulatory Visit | Attending: Radiation Oncology | Admitting: Radiation Oncology

## 2023-07-24 DIAGNOSIS — C50212 Malignant neoplasm of upper-inner quadrant of left female breast: Secondary | ICD-10-CM | POA: Diagnosis not present

## 2023-07-24 DIAGNOSIS — Z510 Encounter for antineoplastic radiation therapy: Secondary | ICD-10-CM | POA: Diagnosis not present

## 2023-07-24 DIAGNOSIS — Z170 Estrogen receptor positive status [ER+]: Secondary | ICD-10-CM | POA: Diagnosis not present

## 2023-07-24 LAB — RAD ONC ARIA SESSION SUMMARY
Course Elapsed Days: 22
Plan Fractions Treated to Date: 16
Plan Prescribed Dose Per Fraction: 2.67 Gy
Plan Total Fractions Prescribed: 16
Plan Total Prescribed Dose: 42.72 Gy
Reference Point Dosage Given to Date: 42.72 Gy
Reference Point Session Dosage Given: 2.67 Gy
Session Number: 16

## 2023-07-25 ENCOUNTER — Ambulatory Visit: Payer: PPO

## 2023-07-25 NOTE — Radiation Completion Notes (Signed)
Patient Name: Jill Adams, Jill Adams MRN: [id number] Date of Birth: 1951-07-31 Referring Physician: Herbie Drape, M.D. Date of Service: 2023-07-25 Radiation Oncologist: Arnette Schaumann, M.D. [facility]                             RADIATION ONCOLOGY END OF TREATMENT NOTE     Diagnosis: C50.212 Malignant neoplasm of upper-inner quadrant of left female breast Staging on 2023-05-07: Malignant neoplasm of upper-inner quadrant of left breast in female, estrogen receptor positive (HCC) T=cT1a, N=cN0, M=cM0 Intent: Curative     ==========DELIVERED PLANS==========  First Treatment Date: 2023-07-02 Last Treatment Date: 2023-07-24   Plan Name: Breast_L Site: Breast, Left Technique: 3D Mode: Photon Dose Per Fraction: 2.67 Gy Prescribed Dose (Delivered / Prescribed): 42.72 Gy / 42.72 Gy Prescribed Fxs (Delivered / Prescribed): 16 / 16     ==========ON TREATMENT VISIT DATES========== 2023-07-07, 2023-07-14, 2023-07-22     ==========UPCOMING VISITS==========       ==========APPENDIX - ON TREATMENT VISIT NOTES==========   See weekly On Treatment Notes in Epic for details in the Media tab (listed as Progress notes on the On Treatment Visit Dates listed above).

## 2023-08-04 IMAGING — MG MM DIGITAL SCREENING BILAT W/ TOMO AND CAD
8 series · 8 of 24 positions shown · non-contrast
Comparison: Previous exam(s).

CLINICAL DATA: Screening.

EXAM:
DIGITAL SCREENING BILATERAL MAMMOGRAM WITH TOMOSYNTHESIS AND CAD
TECHNIQUE: Bilateral screening digital craniocaudal and mediolateral oblique
mammograms were obtained. Bilateral screening digital breast
tomosynthesis was performed. The images were evaluated with
computer-aided detection.

[R CC synth-2D]
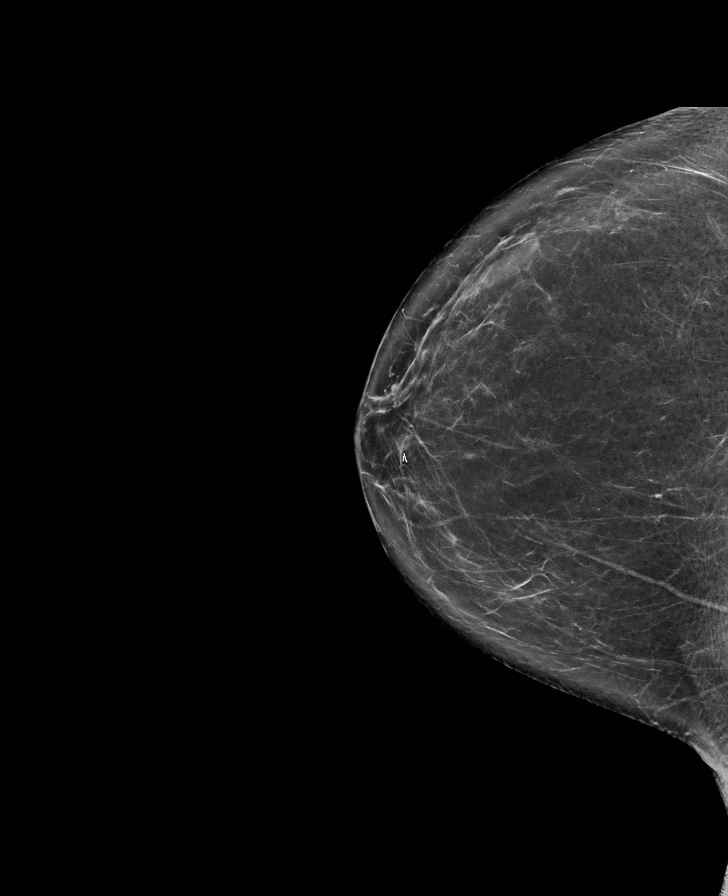

[L MLO synth-2D]
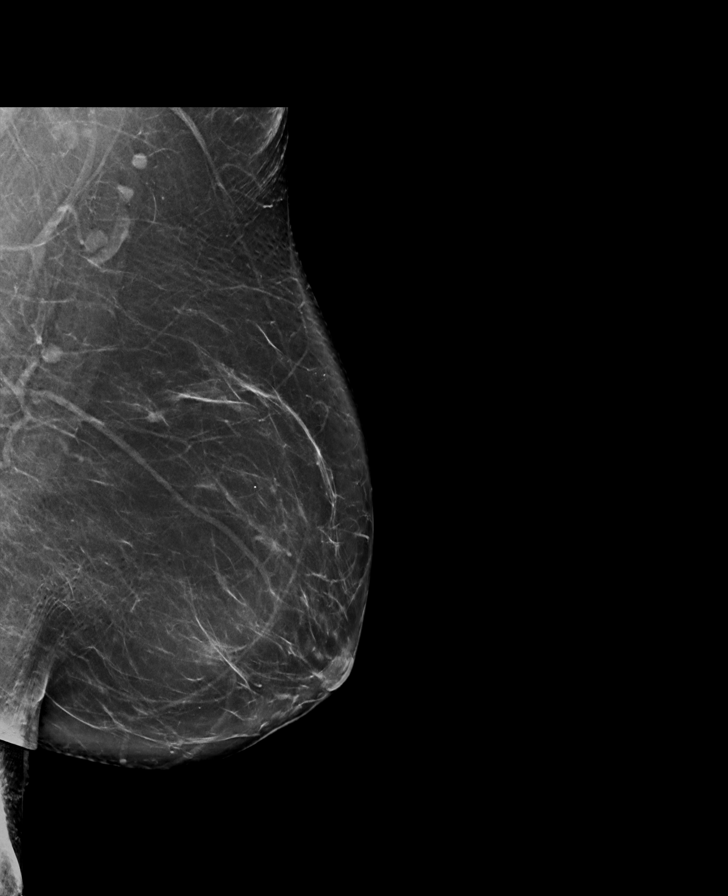

[R MLO synth-2D]
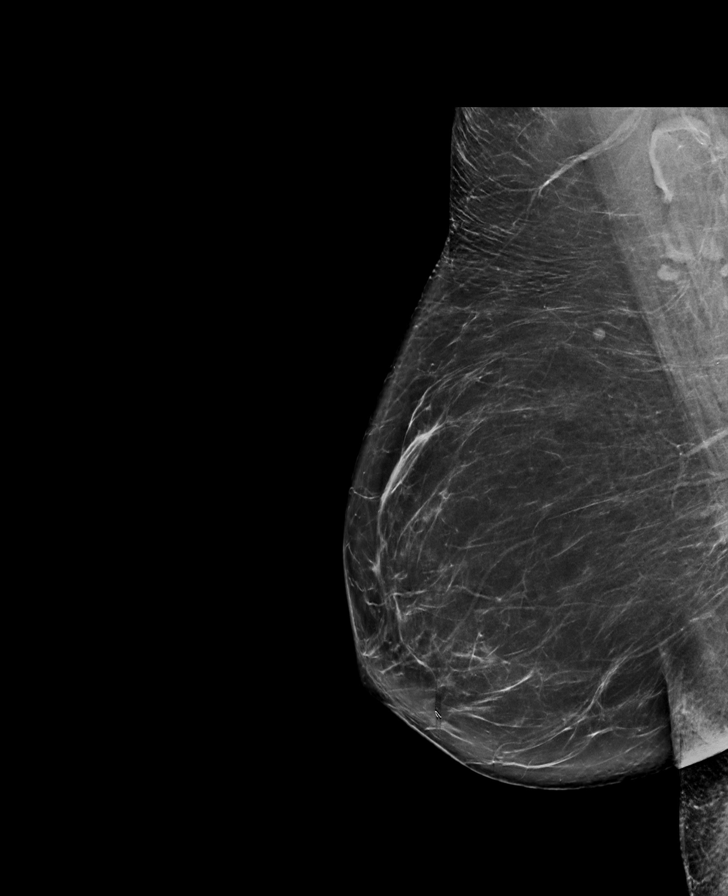

[L CC synth-2D]
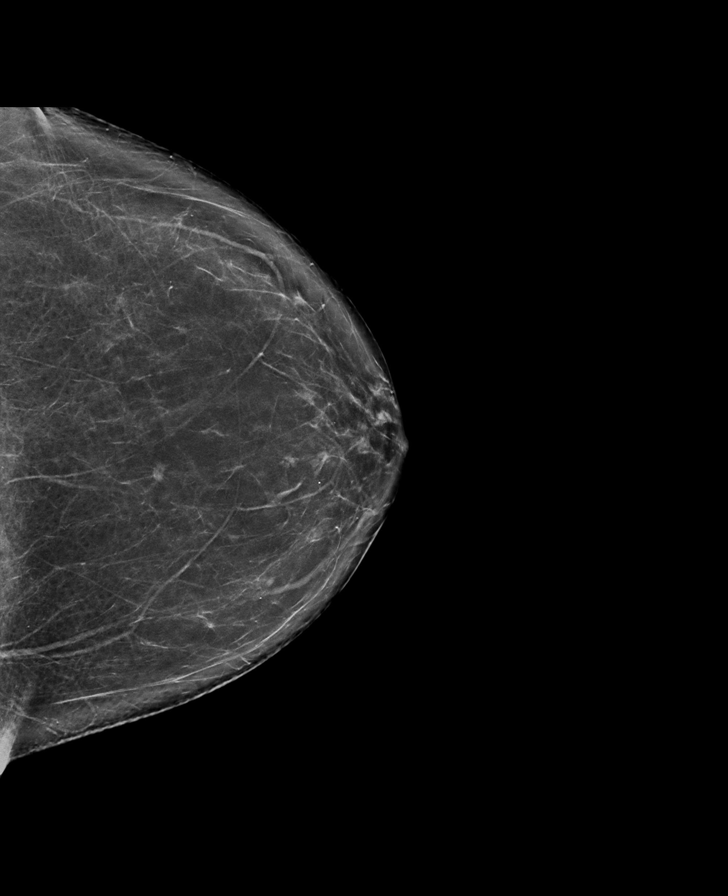

[R CC tomo · tomo slice 39/78.0]
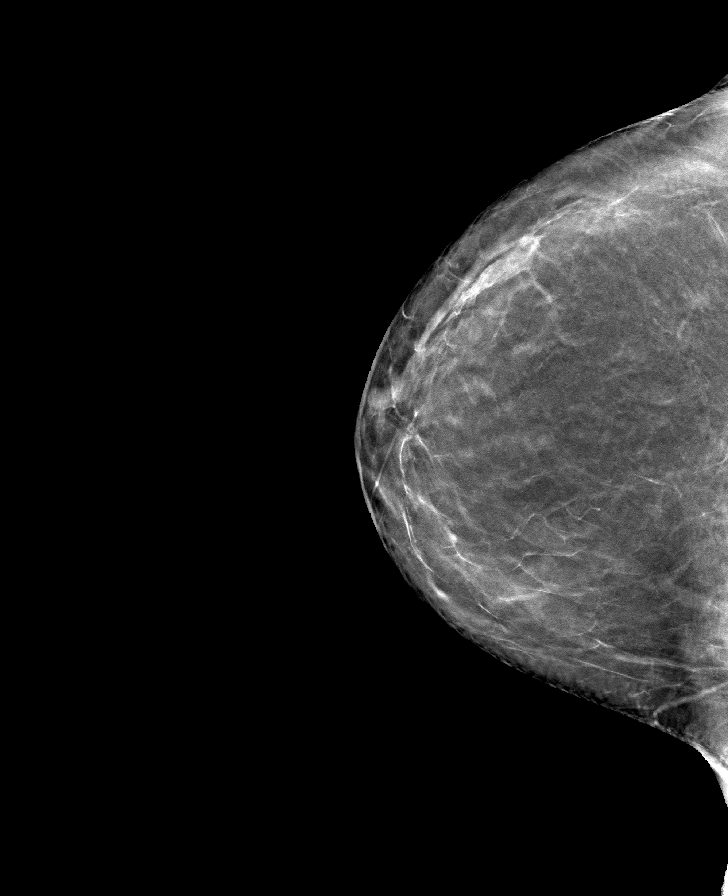

[R MLO tomo · tomo slice 43/86.0]
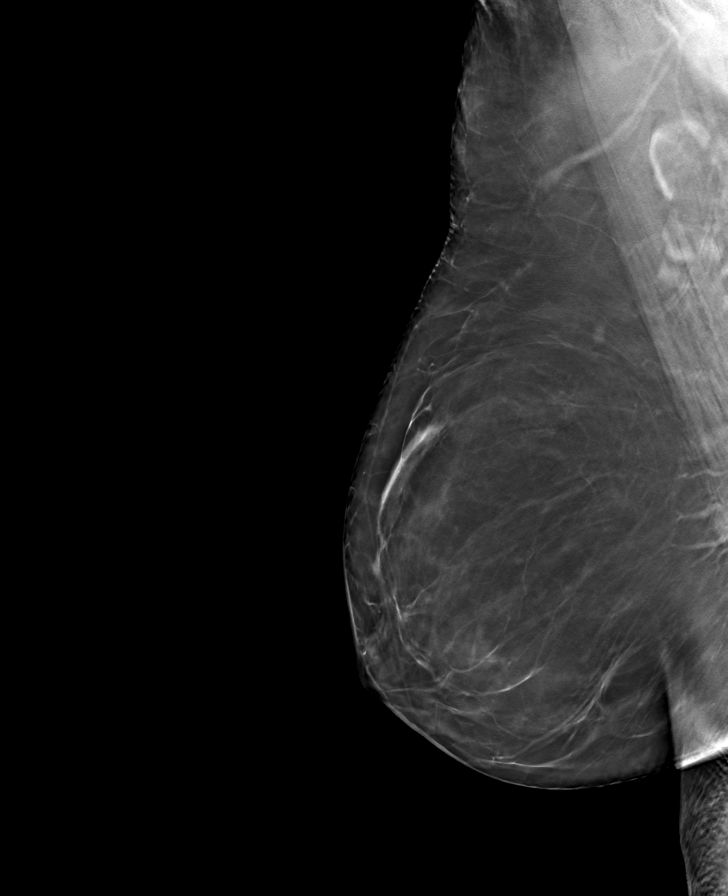

[L CC tomo · tomo slice 40/79.0]
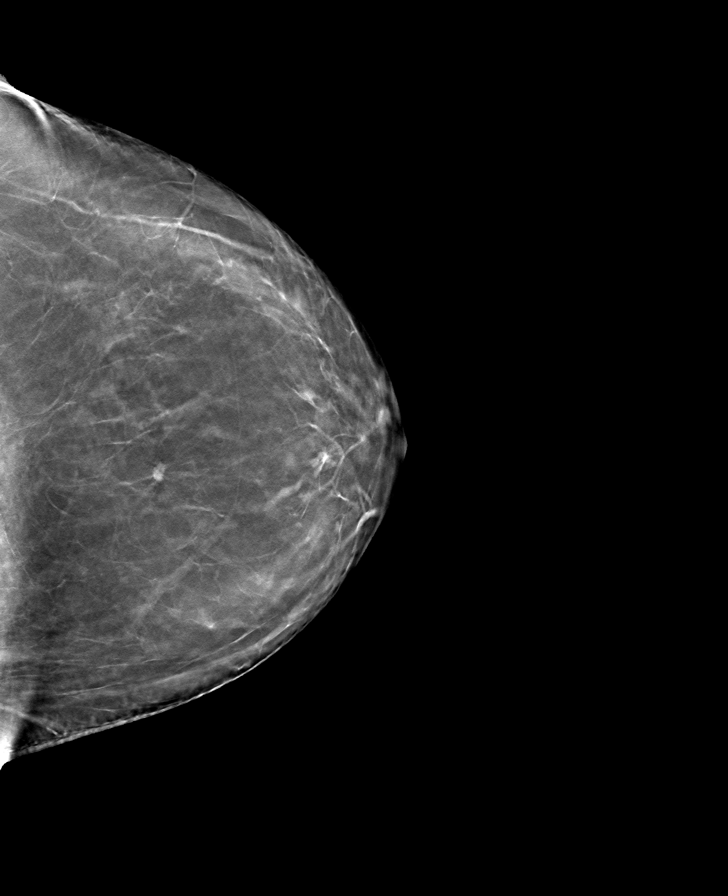

[L MLO tomo · tomo slice 47/94.0]
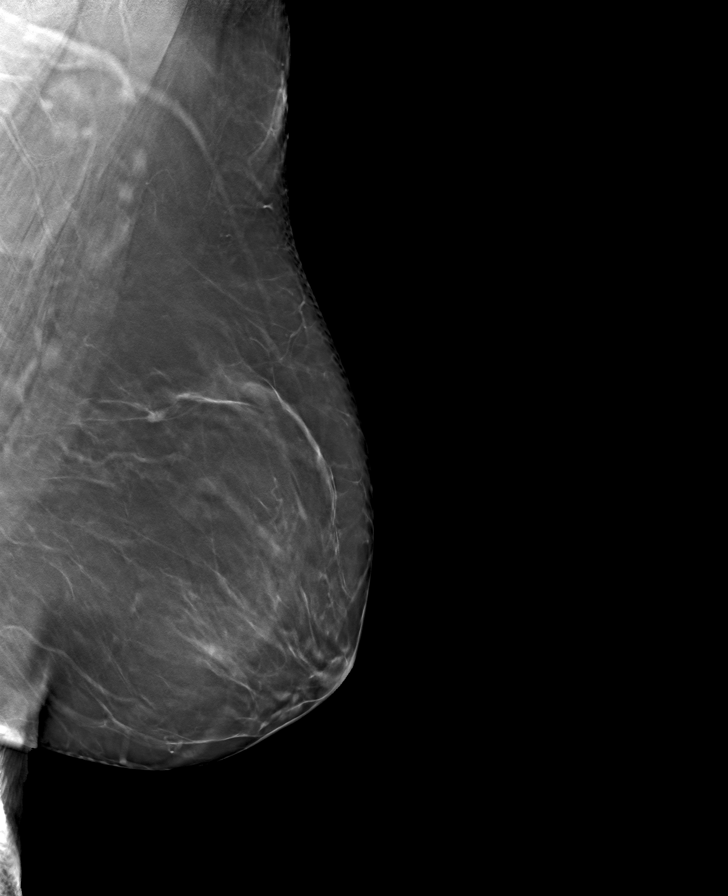

[8 of 24 positions shown; findings below may reference images not displayed]

ACR Breast Density Category b: There are scattered areas of
fibroglandular density.
FINDINGS: There are no findings suspicious for malignancy.
IMPRESSION: No mammographic evidence of malignancy. A result letter of this
screening mammogram will be mailed directly to the patient.

RECOMMENDATION:
Screening mammogram in one year. (Code:51-O-LD2)

BI-RADS CATEGORY  1: Negative.

## 2023-08-05 ENCOUNTER — Inpatient Hospital Stay: Payer: PPO | Attending: Hematology and Oncology | Admitting: Hematology and Oncology

## 2023-08-05 DIAGNOSIS — Z79811 Long term (current) use of aromatase inhibitors: Secondary | ICD-10-CM | POA: Insufficient documentation

## 2023-08-05 DIAGNOSIS — F17210 Nicotine dependence, cigarettes, uncomplicated: Secondary | ICD-10-CM | POA: Diagnosis not present

## 2023-08-05 DIAGNOSIS — C50212 Malignant neoplasm of upper-inner quadrant of left female breast: Secondary | ICD-10-CM | POA: Diagnosis not present

## 2023-08-05 DIAGNOSIS — Z8041 Family history of malignant neoplasm of ovary: Secondary | ICD-10-CM | POA: Insufficient documentation

## 2023-08-05 DIAGNOSIS — M170 Bilateral primary osteoarthritis of knee: Secondary | ICD-10-CM | POA: Diagnosis not present

## 2023-08-05 DIAGNOSIS — Z170 Estrogen receptor positive status [ER+]: Secondary | ICD-10-CM | POA: Diagnosis not present

## 2023-08-05 DIAGNOSIS — Z923 Personal history of irradiation: Secondary | ICD-10-CM | POA: Insufficient documentation

## 2023-08-05 DIAGNOSIS — Z79899 Other long term (current) drug therapy: Secondary | ICD-10-CM | POA: Insufficient documentation

## 2023-08-05 MED ORDER — ANASTROZOLE 1 MG PO TABS: 1.0000 mg | ORAL_TABLET | Freq: Every day | ORAL | 3 refills | Status: DC

## 2023-08-05 NOTE — Assessment & Plan Note (Addendum)
This is a pleasant 72 year old postmenopausal female patient with screen detected left breast mass, further investigation showed 5 mm mass in the left breast upper inner quadrant 6 cm from the nipple.  Pathology from the left breast biopsy confirmed grade 1 IDC, prognostic showed ER 100% positive strong staining, PR 40% positive strong staining, Ki-67 1% and group 5 HER2 negative.  She is now status post lumpectomy, final pathology showed 1.1 cm IDC, negative margins, no lymph node specimen submitted.  Oncotype DX resulted at 15, no definitive evidence of added chemotherapy.  Risk of recurrence at 9 years outside the breast in node-negative setting comes to around 3%.  Breast Cancer Completed active treatment. Discussed starting Anastrozole, an aromatase inhibitor, to reduce estrogen production and thereby limit cancer cell growth. Potential side effects include menopausal symptoms and possible accelerated bone density loss. -Start Anastrozole, to be picked up from [employer]. -Monitor for side effects and report any new symptoms. -Schedule follow-up in 3-4 months with survivorship care practitioner.  Bone Health Discussed potential impact of Anastrozole on bone density. Patient has scheduled bone density test in February. -Encouraged weight-bearing exercises and resistance band training to maintain bone health. -Continue monitoring bone density, with next scan scheduled for February.  General Health RTC as scheduled.
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
## 2023-08-21 ENCOUNTER — Telehealth: Payer: Self-pay | Admitting: Radiation Oncology

## 2023-08-21 ENCOUNTER — Telehealth: Payer: Self-pay | Admitting: *Deleted

## 2023-08-21 NOTE — Telephone Encounter (Signed)
 Pt called with questions about post-tx f/u appt. Pt was advised she would receive call from team to schedule this appt. Pt verbalized understanding.

## 2023-08-21 NOTE — Telephone Encounter (Signed)
 RETURNED PATIENT'S PHONE CALL, SPOKE WITH PATIENT. ?

## 2023-08-27 ENCOUNTER — Encounter: Payer: Self-pay | Admitting: Radiation Oncology

## 2023-08-27 ENCOUNTER — Telehealth: Payer: Self-pay | Admitting: *Deleted

## 2023-08-27 ENCOUNTER — Other Ambulatory Visit: Payer: Self-pay | Admitting: Medical Genetics

## 2023-08-27 NOTE — Telephone Encounter (Signed)
 RETURNED PATIENT'S PHONE CALL, SPOKE WITH PATIENT. ?

## 2023-08-28 ENCOUNTER — Ambulatory Visit: Payer: PPO | Admitting: Radiation Oncology

## 2023-09-03 NOTE — Progress Notes (Signed)
Radiation Oncology         [phone] ________________________________  Name: Jill Adams MRN: [id number]  Date: 09/04/2023  DOB: 1951/07/17  Follow-Up Visit Note  CC: Loyola Mast, MD  Loyola Mast, MD    ICD-10-CM   1. Malignant neoplasm of upper-inner quadrant of left breast in female, estrogen receptor positive [facility])  C50.212    Z17.0       Diagnosis:  Stage IA (pT1c, cN0, cM0) Left Breast UIQ, Invasive and in situ ductal carcinoma, ER+ / PR+ / Her2-, Grade 1; s/p lumpectomy and radiation  Interval Since Last Radiation:  1 month 11 days  Radiation treatment dates:    First treatment date 07-02-2023 last treatment date 07-24-2023   Site/Dose/Technique/Mode:  Site: Breast, Left Technique: 3D Mode: Photon Dose Per Fraction: 2.67 Gy Prescribed Dose (Delivered / Prescribed): 42.72 Gy / 42.72 Gy Prescribed Fxs (Delivered / Prescribed): 16 / 16   Narrative: The patient tolerated radiation treatment relatively well. The patient tolerated radiation treatment relatively well. She did however experience fatigue and skin irritation that mainly consisted of itching.   Since his last consultation visit on 06-18-23. She followed up with Dr. Rachel Moulds on 08-05-23. At that time she was doing well overall, she had began gaining her energy back after radiation treatments. Dr. Al Pimple started her on Anastrozole at that time.   No other significant oncologic interval history since he completed radiation therapy, or in the interval since his initial consultation date.   Today, the patient states to be doing well overall. She is pleased with the way her skin has healed since completing the radiation treatment. She notes some residual fatigue and breast "twinges", but otherwise denies any breast pain, swelling, or issues with range of motion.  Of note: she has a bone density scan scheduled on  10-17-23.                        Allergies:  has no active allergies.  Meds: Current  Outpatient Medications  Medication Sig Dispense Refill   anastrozole (ARIMIDEX) 1 MG tablet Take 1 tablet (1 mg total) by mouth daily. 90 tablet 3   atorvastatin (LIPITOR) 40 MG tablet TAKE ONE AND A HALF TABLETS BY MOUTH EVERY DAY 135 tablet 2   cholecalciferol (VITAMIN D3) 25 MCG (1000 UNIT) tablet Take 1,000 Units by mouth daily.     diphenhydrAMINE (BENADRYL) 25 MG tablet Take 25 mg by mouth every 6 (six) hours as needed.     diphenhydramine-acetaminophen (TYLENOL PM) 25-500 MG TABS tablet Take 1 tablet by mouth at bedtime as needed.     escitalopram (LEXAPRO) 20 MG tablet Take 1 tablet (20 mg total) by mouth daily. 90 tablet 3   fluticasone (FLONASE) 50 MCG/ACT nasal spray INHALE 2 SPRAYS IN EACH NOSTRIL DAILY 16 g 6   levothyroxine (SYNTHROID) 75 MCG tablet TAKE 1 TABLET BY MOUTH DAILY BEFORE BREAKFAST 90 tablet 3   Multiple Vitamin (MULTIVITAMIN) capsule Take 1 capsule by mouth daily.     No current facility-administered medications for this encounter.    Physical Findings: The patient is in no acute distress. Patient is alert and oriented.  height is 5\' 6"  (1.676 m) and weight is 187 lb (84.8 kg). Her temperature is 97.8 F (36.6 C). Her blood pressure is 143/68 (abnormal) and her pulse is 104 (abnormal). Her respiration is 20 and oxygen saturation is 99%. .  No significant changes. Lungs are clear to  auscultation bilaterally. Heart has regular rate and rhythm. No palpable cervical, supraclavicular, or axillary adenopathy. Abdomen soft, non-tender, normal bowel sounds. Full ROM in bilateral shoulders.   Right breast: no palpable mass or other appreciated abnormalities in the breast or axilla. Left breast: Hyperpigmented skin within the treatment fields and a well healed lumpectomy scar with minimal scar tissue palpated. No palpable masses or other appreciated abnormalities in the breast or axilla.   Lab Findings: Lab Results  Component Value Date   WBC 6.7 05/07/2023   HGB 14.0  05/07/2023   HCT 40.5 05/07/2023   MCV 98.5 05/07/2023   PLT 416 (H) 05/07/2023    Radiographic Findings: No results found.  Impression: Stage IA (pT1c, cN0, cM0) Left Breast UIQ, Invasive and in situ ductal carcinoma, ER+ / PR+ / Her2-, Grade 1; s/p lumpectomy and radiation  The patient is doing well overall today and continues to heal from the effects of radiation treatment.   Plan: Patient will continue on anastrozole under the care of Dr. Al Pimple. She is scheduled to see Lillard Anes for survivorship clinic on 11/06/2023. Radiation follow-up PRN. She has been advised to call back with any questions or concerns. It was a privilege taking part in this patient's care.    20 minutes of total time was spent for this patient encounter, including preparation, face-to-face counseling with the patient and coordination of care, physical exam, and documentation of the encounter. ____________________________________   Bryan Lemma, PA-C  This document serves as a record of services personally performed by Bryan Lemma, PA-C. It was created on his behalf by Herbie Saxon, a trained medical scribe. The creation of this record is based on the scribe's personal observations and the provider's statements to them. This document has been checked and approved by the attending provider.

## 2023-09-03 NOTE — Progress Notes (Signed)
  Radiation Oncology         [phone] ________________________________  Name: Jill Adams MRN: [id number]  Date: 09/04/2023  DOB: [birth year]  End of Treatment Note  Diagnosis: C50.212 Malignant neoplasm of upper-inner quadrant of left female breast Staging on 2023-05-07: Malignant neoplasm of upper-inner quadrant of left breast in female, estrogen receptor positive (HCC) T=cT1a, N=cN0, M=cM0  Indication for treatment:  Curative        Radiation treatment dates:    First treatment date 07-02-2023 last treatment date 07-24-2023  Site/Dose/Technique/Mode:  Site: Breast, Left Technique: 3D Mode: Photon Dose Per Fraction: 2.67 Gy Prescribed Dose (Delivered / Prescribed): 42.72 Gy / 42.72 Gy Prescribed Fxs (Delivered / Prescribed): 16 / 16  Narrative: The patient tolerated radiation treatment relatively well. The patient tolerated radiation treatment relatively well. She did however experience fatigue and skin irritation that mainly consisted of itching.   Plan: The patient has completed radiation treatment. The patient will return to radiation oncology clinic for routine followup in one month. I advised them to call or return sooner if they have any questions or concerns related to their recovery or treatment.  -----------------------------------  Billie Lade, PhD, MD   Ladona Mow Hewitt Shorts as a scribe for Antony Blackbird, MD.,have documented all relevant documentation on the behalf of Antony Blackbird, MD,as directed by  Antony Blackbird, MD while in the presence of Antony Blackbird, MD.   I, Antony Blackbird, MD, have reviewed all documentation for this visit. The documentation on 09/03/23 for the exam, diagnosis, procedures, and orders are all accurate and complete.

## 2023-09-04 ENCOUNTER — Encounter: Payer: Self-pay | Admitting: Family Medicine

## 2023-09-04 ENCOUNTER — Encounter: Payer: Self-pay | Admitting: Radiation Oncology

## 2023-09-04 ENCOUNTER — Ambulatory Visit
Admission: RE | Admit: 2023-09-04 | Discharge: 2023-09-04 | Disposition: A | Discharge status: Home / Self Care | Payer: PPO | Source: Ambulatory Visit | Attending: Radiation Oncology | Admitting: Radiation Oncology

## 2023-09-04 VITALS — BP 143/68 | HR 104 | Temp 97.8°F | Resp 20 | Ht 66.0 in | Wt 187.0 lb

## 2023-09-04 DIAGNOSIS — Z170 Estrogen receptor positive status [ER+]: Secondary | ICD-10-CM | POA: Diagnosis not present

## 2023-09-04 DIAGNOSIS — Z923 Personal history of irradiation: Secondary | ICD-10-CM | POA: Insufficient documentation

## 2023-09-04 DIAGNOSIS — C50212 Malignant neoplasm of upper-inner quadrant of left female breast: Secondary | ICD-10-CM | POA: Insufficient documentation

## 2023-09-04 DIAGNOSIS — Z79899 Other long term (current) drug therapy: Secondary | ICD-10-CM | POA: Insufficient documentation

## 2023-09-04 DIAGNOSIS — Z79811 Long term (current) use of aromatase inhibitors: Secondary | ICD-10-CM | POA: Diagnosis not present

## 2023-09-04 NOTE — Progress Notes (Addendum)
Jill Adams is here today for follow up post radiation to the breast. Patient identity verified x2.   Breast Side:LT  They completed their radiation on: First treatment date 07-02-2023 last treatment date 07-24-2023    Does the patient complain of any of the following: Post radiation skin issues: Mild redness Breast Tenderness: None Breast Swelling: None Lymphadema: None Range of Motion limitations: None Fatigue post radiation: Moderate fatigue Appetite good/fair/poor: Great  Additional comments if applicable:   This concludes the interaction.  Ruel Favors, LPN

## 2023-09-05 DIAGNOSIS — H2513 Age-related nuclear cataract, bilateral: Secondary | ICD-10-CM | POA: Diagnosis not present

## 2023-09-09 ENCOUNTER — Ambulatory Visit: Payer: PPO | Admitting: Family Medicine

## 2023-09-17 ENCOUNTER — Other Ambulatory Visit (HOSPITAL_COMMUNITY): Payer: Self-pay | Attending: Medical Genetics

## 2023-10-03 ENCOUNTER — Encounter: Payer: Self-pay | Admitting: Family Medicine

## 2023-10-16 ENCOUNTER — Other Ambulatory Visit: Payer: Self-pay | Admitting: Family Medicine

## 2023-10-16 DIAGNOSIS — Z780 Asymptomatic menopausal state: Secondary | ICD-10-CM

## 2023-10-16 DIAGNOSIS — Z13820 Encounter for screening for osteoporosis: Secondary | ICD-10-CM

## 2023-10-17 ENCOUNTER — Other Ambulatory Visit: Payer: PPO

## 2023-10-24 DIAGNOSIS — F4321 Adjustment disorder with depressed mood: Secondary | ICD-10-CM | POA: Diagnosis not present

## 2023-10-27 ENCOUNTER — Other Ambulatory Visit: Payer: Self-pay | Admitting: Family Medicine

## 2023-10-27 DIAGNOSIS — E039 Hypothyroidism, unspecified: Secondary | ICD-10-CM

## 2023-11-04 ENCOUNTER — Other Ambulatory Visit: Payer: Self-pay | Admitting: Family Medicine

## 2023-11-04 DIAGNOSIS — F325 Major depressive disorder, single episode, in full remission: Secondary | ICD-10-CM

## 2023-11-06 ENCOUNTER — Inpatient Hospital Stay: Payer: PPO | Admitting: Adult Health

## 2023-11-06 ENCOUNTER — Telehealth: Payer: Self-pay | Admitting: Adult Health

## 2023-11-12 ENCOUNTER — Encounter: Payer: Self-pay | Admitting: Family Medicine
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
## 2023-11-28 ENCOUNTER — Encounter: Payer: Self-pay | Admitting: Adult Health

## 2023-11-28 ENCOUNTER — Inpatient Hospital Stay: Attending: Adult Health | Admitting: Adult Health

## 2023-11-28 VITALS — BP 135/63 | HR 99 | Temp 97.3°F | Resp 16 | Wt 188.1 lb

## 2023-11-28 DIAGNOSIS — Z1732 Human epidermal growth factor receptor 2 negative status: Secondary | ICD-10-CM | POA: Insufficient documentation

## 2023-11-28 DIAGNOSIS — Z79899 Other long term (current) drug therapy: Secondary | ICD-10-CM | POA: Insufficient documentation

## 2023-11-28 DIAGNOSIS — C50212 Malignant neoplasm of upper-inner quadrant of left female breast: Secondary | ICD-10-CM | POA: Insufficient documentation

## 2023-11-28 DIAGNOSIS — Z170 Estrogen receptor positive status [ER+]: Secondary | ICD-10-CM | POA: Insufficient documentation

## 2023-11-28 DIAGNOSIS — Z1721 Progesterone receptor positive status: Secondary | ICD-10-CM | POA: Diagnosis not present

## 2023-11-28 DIAGNOSIS — Z8041 Family history of malignant neoplasm of ovary: Secondary | ICD-10-CM | POA: Insufficient documentation

## 2023-11-28 DIAGNOSIS — M8588 Other specified disorders of bone density and structure, other site: Secondary | ICD-10-CM | POA: Insufficient documentation

## 2023-11-28 DIAGNOSIS — Z923 Personal history of irradiation: Secondary | ICD-10-CM | POA: Insufficient documentation

## 2023-11-28 DIAGNOSIS — F17210 Nicotine dependence, cigarettes, uncomplicated: Secondary | ICD-10-CM | POA: Diagnosis not present

## 2023-11-28 DIAGNOSIS — Z79811 Long term (current) use of aromatase inhibitors: Secondary | ICD-10-CM | POA: Insufficient documentation

## 2024-01-02 ENCOUNTER — Ambulatory Visit (INDEPENDENT_AMBULATORY_CARE_PROVIDER_SITE_OTHER): Admitting: Family Medicine

## 2024-01-02 ENCOUNTER — Encounter: Payer: Self-pay | Admitting: Family Medicine

## 2024-01-02 VITALS — BP 124/74 | HR 80 | Temp 97.2°F | Ht 66.0 in | Wt 187.4 lb

## 2024-01-02 DIAGNOSIS — M170 Bilateral primary osteoarthritis of knee: Secondary | ICD-10-CM

## 2024-01-02 DIAGNOSIS — E039 Hypothyroidism, unspecified: Secondary | ICD-10-CM | POA: Diagnosis not present

## 2024-01-02 DIAGNOSIS — Z853 Personal history of malignant neoplasm of breast: Secondary | ICD-10-CM | POA: Insufficient documentation

## 2024-01-02 DIAGNOSIS — C50212 Malignant neoplasm of upper-inner quadrant of left female breast: Secondary | ICD-10-CM

## 2024-01-02 DIAGNOSIS — Z170 Estrogen receptor positive status [ER+]: Secondary | ICD-10-CM | POA: Diagnosis not present

## 2024-01-02 MED ORDER — NAPROXEN 500 MG PO TABS
500.0000 mg | ORAL_TABLET | Freq: Two times a day (BID) | ORAL | 0 refills | Status: DC
Start: 2024-01-02 — End: 2024-03-26

## 2024-01-02 NOTE — Assessment & Plan Note (Signed)
 Discussed possible consideration for an intra-articular gel injection. She will let me know if she wants to pursue this. In the meantime, I will provide her with naproxen  to have on hand. I also suggested she consider use of neoprene knee sleeves to provide some support when out walking.

## 2024-01-02 NOTE — Assessment & Plan Note (Signed)
 I will check a TSH today. Continue levothyroxine  75 mcg daily.
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
## 2024-01-02 NOTE — Assessment & Plan Note (Signed)
 Stable. Continue anastrozole  1 mg daily. Her next mammogram will be in Dec. 2025.

## 2024-01-06 ENCOUNTER — Ambulatory Visit: Payer: Self-pay | Admitting: Family Medicine

## 2024-01-06 DIAGNOSIS — E039 Hypothyroidism, unspecified: Secondary | ICD-10-CM

## 2024-01-06 LAB — TSH: TSH: 7.28 u[IU]/mL — ABNORMAL HIGH (ref 0.35–5.50)

## 2024-01-13 MED ORDER — LEVOTHYROXINE SODIUM 88 MCG PO TABS
88.0000 ug | ORAL_TABLET | Freq: Every day | ORAL | 3 refills | Status: DC
Start: 2024-01-13 — End: 2024-03-12

## 2024-01-22 ENCOUNTER — Encounter: Payer: Self-pay | Admitting: Family Medicine

## 2024-01-22 ENCOUNTER — Other Ambulatory Visit: Payer: Self-pay | Admitting: Family Medicine

## 2024-01-22 DIAGNOSIS — F325 Major depressive disorder, single episode, in full remission: Secondary | ICD-10-CM

## 2024-01-22 MED ORDER — ESCITALOPRAM OXALATE 20 MG PO TABS
20.0000 mg | ORAL_TABLET | Freq: Every day | ORAL | 2 refills | Status: AC
Start: 2024-01-22 — End: ?

## 2024-01-22 NOTE — Addendum Note (Signed)
 Addended by: Vergil Glasser on: 01/22/2024 12:04 PM   Modules accepted: Orders

## 2024-02-02 ENCOUNTER — Other Ambulatory Visit: Payer: Self-pay | Admitting: Family Medicine

## 2024-02-02 DIAGNOSIS — E782 Mixed hyperlipidemia: Secondary | ICD-10-CM

## 2024-03-01 ENCOUNTER — Encounter: Payer: Self-pay | Admitting: Family Medicine

## 2024-03-01 NOTE — Telephone Encounter (Signed)
Please review message and advise.   Thanks. Dm/cma  

## 2024-03-12 ENCOUNTER — Encounter: Payer: Self-pay | Admitting: Family Medicine

## 2024-03-12 ENCOUNTER — Other Ambulatory Visit: Payer: Self-pay | Admitting: Family Medicine

## 2024-03-12 ENCOUNTER — Ambulatory Visit (INDEPENDENT_AMBULATORY_CARE_PROVIDER_SITE_OTHER): Admitting: Family Medicine

## 2024-03-12 ENCOUNTER — Ambulatory Visit: Payer: Self-pay | Admitting: Family Medicine

## 2024-03-12 VITALS — BP 134/68 | HR 88 | Temp 98.0°F | Ht 66.0 in | Wt 157.2 lb

## 2024-03-12 DIAGNOSIS — E039 Hypothyroidism, unspecified: Secondary | ICD-10-CM | POA: Diagnosis not present

## 2024-03-12 DIAGNOSIS — M170 Bilateral primary osteoarthritis of knee: Secondary | ICD-10-CM

## 2024-03-12 LAB — TSH: TSH: 9.97 u[IU]/mL — ABNORMAL HIGH (ref 0.35–5.50)

## 2024-03-12 MED ORDER — LEVOTHYROXINE SODIUM 100 MCG PO TABS
100.0000 ug | ORAL_TABLET | Freq: Every day | ORAL | 3 refills | Status: DC
Start: 2024-03-12 — End: 2024-03-15

## 2024-03-12 NOTE — Assessment & Plan Note (Signed)
 I will check a TSH today. Continue levothyroxine  88 mcg daily.

## 2024-03-12 NOTE — Assessment & Plan Note (Signed)
 I recommend we refer Jill Adams for consideration of an intra-articular gel injection.
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
## 2024-03-26 ENCOUNTER — Other Ambulatory Visit (INDEPENDENT_AMBULATORY_CARE_PROVIDER_SITE_OTHER): Payer: Self-pay

## 2024-03-26 ENCOUNTER — Ambulatory Visit: Admitting: Orthopaedic Surgery

## 2024-03-26 DIAGNOSIS — M1711 Unilateral primary osteoarthritis, right knee: Secondary | ICD-10-CM

## 2024-03-26 DIAGNOSIS — M1712 Unilateral primary osteoarthritis, left knee: Secondary | ICD-10-CM | POA: Diagnosis not present

## 2024-03-26 DIAGNOSIS — M170 Bilateral primary osteoarthritis of knee: Secondary | ICD-10-CM

## 2024-03-26 MED ORDER — NAPROXEN 500 MG PO TABS: 500.0000 mg | ORAL_TABLET | Freq: Two times a day (BID) | ORAL | 6 refills | Status: AC
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
## 2024-04-08 ENCOUNTER — Ambulatory Visit (INDEPENDENT_AMBULATORY_CARE_PROVIDER_SITE_OTHER): Payer: PPO | Admitting: Otolaryngology

## 2024-04-26 ENCOUNTER — Encounter: Payer: Self-pay | Admitting: Family Medicine

## 2024-05-05 ENCOUNTER — Other Ambulatory Visit: Payer: Self-pay | Admitting: Hematology and Oncology

## 2024-05-12 DIAGNOSIS — F4321 Adjustment disorder with depressed mood: Secondary | ICD-10-CM | POA: Diagnosis not present

## 2024-05-20 NOTE — Progress Notes (Signed)
   05/20/2024  Patient ID: Jill Adams Candy, female   DOB: 1951-04-04, 73 y.o.   MRN: [id number]  This patient is appearing on a report for being at risk of failing the adherence measure for cholesterol (statin) medications this calendar year.   Medication: Atorvastatin  40 mg tablet Last fill date: 05/04/2024 for 90 day supply  Insurance report was not up to date. No action needed at this time.   Chandrika Sandles C. Savana Spina [facility] PharmD Candidate Class of [phone]

## 2024-05-28 ENCOUNTER — Telehealth: Payer: Self-pay

## 2024-05-28 NOTE — Telephone Encounter (Signed)
 Spoke with patient and confirmed appointment on 10/13

## 2024-05-31 ENCOUNTER — Inpatient Hospital Stay: Attending: Hematology and Oncology | Admitting: Hematology and Oncology

## 2024-05-31 VITALS — BP 140/72 | HR 83 | Temp 98.2°F | Resp 16 | Wt 191.4 lb

## 2024-05-31 DIAGNOSIS — C50212 Malignant neoplasm of upper-inner quadrant of left female breast: Secondary | ICD-10-CM | POA: Insufficient documentation

## 2024-05-31 DIAGNOSIS — Z8041 Family history of malignant neoplasm of ovary: Secondary | ICD-10-CM | POA: Diagnosis not present

## 2024-05-31 DIAGNOSIS — Z170 Estrogen receptor positive status [ER+]: Secondary | ICD-10-CM | POA: Insufficient documentation

## 2024-05-31 DIAGNOSIS — Z79899 Other long term (current) drug therapy: Secondary | ICD-10-CM | POA: Diagnosis not present

## 2024-05-31 DIAGNOSIS — Z79811 Long term (current) use of aromatase inhibitors: Secondary | ICD-10-CM | POA: Insufficient documentation

## 2024-05-31 DIAGNOSIS — Z923 Personal history of irradiation: Secondary | ICD-10-CM | POA: Diagnosis not present

## 2024-05-31 DIAGNOSIS — Z1732 Human epidermal growth factor receptor 2 negative status: Secondary | ICD-10-CM | POA: Insufficient documentation

## 2024-05-31 DIAGNOSIS — F17210 Nicotine dependence, cigarettes, uncomplicated: Secondary | ICD-10-CM | POA: Diagnosis not present

## 2024-05-31 DIAGNOSIS — Z1721 Progesterone receptor positive status: Secondary | ICD-10-CM | POA: Insufficient documentation
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
## 2024-06-11 ENCOUNTER — Ambulatory Visit: Payer: PPO

## 2024-06-11 DIAGNOSIS — Z0000 Encounter for general adult medical examination without abnormal findings: Secondary | ICD-10-CM | POA: Diagnosis not present

## 2024-06-11 NOTE — Patient Instructions (Signed)
 Jill Adams,  Thank you for taking the time for your Medicare Wellness Visit. I appreciate your continued commitment to your health goals. Please review the care plan we discussed, and feel free to reach out if I can assist you further.  Medicare recommends these wellness visits once per year to help you and your care team stay ahead of potential health issues. These visits are designed to focus on prevention, allowing your provider to concentrate on managing your acute and chronic conditions during your regular appointments.  Please note that Annual Wellness Visits do not include a physical exam. Some assessments may be limited, especially if the visit was conducted virtually. If needed, we may recommend a separate in-person follow-up with your provider.  Ongoing Care Seeing your primary care provider every 3 to 6 months helps us  monitor your health and provide consistent, personalized care.   Referrals If a referral was made during today's visit and you haven't received any updates within two weeks, please contact the referred provider directly to check on the status.  Recommended Screenings:  Health Maintenance  Topic Date Due   Breast Cancer Screening  04/06/2024   Medicare Annual Wellness Visit  06/11/2025   Colon Cancer Screening  09/07/2026   DTaP/Tdap/Td vaccine (2 - Td or Tdap) 06/20/2033   Pneumococcal Vaccine for age over 18  Completed   Flu Shot  Completed   DEXA scan (bone density measurement)  Completed   Hepatitis C Screening  Completed   Zoster (Shingles) Vaccine  Completed   Meningitis B Vaccine  Aged Out   COVID-19 Vaccine  Discontinued       06/11/2024    8:50 AM  Advanced Directives  Does Patient Have a Medical Advance Directive? Yes  Type of Estate agent of [place];Living will  Copy of Healthcare Power of Attorney in Chart? No - copy requested   Advance Care Planning is important because it: Ensures you receive medical care that  aligns with your values, goals, and preferences. Provides guidance to your family and loved ones, reducing the emotional burden of decision-making during critical moments.  Vision: Annual vision screenings are recommended for early detection of glaucoma, cataracts, and diabetic retinopathy. These exams can also reveal signs of chronic conditions such as diabetes and high blood pressure.  Dental: Annual dental screenings help detect early signs of oral cancer, gum disease, and other conditions linked to overall health, including heart disease and diabetes.  Please see the attached documents for additional preventive care recommendations.
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
## 2024-06-21 ENCOUNTER — Encounter: Payer: Self-pay | Admitting: Radiology

## 2024-06-21 ENCOUNTER — Encounter: Payer: Self-pay | Admitting: Family Medicine

## 2024-06-23 ENCOUNTER — Other Ambulatory Visit: Payer: Self-pay | Admitting: Medical Genetics

## 2024-06-23 DIAGNOSIS — Z006 Encounter for examination for normal comparison and control in clinical research program: Secondary | ICD-10-CM

## 2024-06-25 ENCOUNTER — Ambulatory Visit: Admitting: Family Medicine

## 2024-07-07 ENCOUNTER — Ambulatory Visit: Admitting: Family Medicine

## 2024-07-09 ENCOUNTER — Ambulatory Visit: Admitting: Family Medicine

## 2024-07-09 ENCOUNTER — Ambulatory Visit: Payer: Self-pay | Admitting: Family Medicine

## 2024-07-09 VITALS — BP 126/84 | HR 89 | Temp 98.2°F | Ht 66.0 in | Wt 191.4 lb

## 2024-07-09 DIAGNOSIS — M170 Bilateral primary osteoarthritis of knee: Secondary | ICD-10-CM | POA: Insufficient documentation

## 2024-07-09 DIAGNOSIS — E782 Mixed hyperlipidemia: Secondary | ICD-10-CM

## 2024-07-09 DIAGNOSIS — E039 Hypothyroidism, unspecified: Secondary | ICD-10-CM

## 2024-07-09 DIAGNOSIS — Z170 Estrogen receptor positive status [ER+]: Secondary | ICD-10-CM | POA: Diagnosis not present

## 2024-07-09 DIAGNOSIS — C50212 Malignant neoplasm of upper-inner quadrant of left female breast: Secondary | ICD-10-CM | POA: Diagnosis not present

## 2024-07-09 LAB — COMPREHENSIVE METABOLIC PANEL WITH GFR
ALT: 22 U/L (ref 0–35)
AST: 16 U/L (ref 0–37)
Albumin: 4.4 g/dL (ref 3.5–5.2)
Alkaline Phosphatase: 69 U/L (ref 39–117)
BUN: 14 mg/dL (ref 6–23)
CO2: 28 meq/L (ref 19–32)
Calcium: 9.8 mg/dL (ref 8.4–10.5)
Chloride: 103 meq/L (ref 96–112)
Creatinine, Ser: 0.68 mg/dL (ref 0.40–1.20)
GFR: 86.44 mL/min (ref >60.00)
Glucose, Bld: 106 mg/dL — ABNORMAL HIGH (ref 70–99)
Potassium: 4.3 meq/L (ref 3.5–5.1)
Sodium: 138 meq/L (ref 135–145)
Total Bilirubin: 0.6 mg/dL (ref 0.2–1.2)
Total Protein: 7.5 g/dL (ref 6.0–8.3)

## 2024-07-09 LAB — TSH: TSH: 3.84 u[IU]/mL (ref 0.35–5.50)

## 2024-07-09 LAB — LIPID PANEL
Cholesterol: 186 mg/dL (ref 0–200)
HDL: 58.5 mg/dL (ref >39.00)
LDL Cholesterol: 106 mg/dL — ABNORMAL HIGH (ref 0–99)
NonHDL: 127.53
Total CHOL/HDL Ratio: 3
Triglycerides: 106 mg/dL (ref 0.0–149.0)
VLDL: 21.2 mg/dL (ref 0.0–40.0)

## 2024-07-09 NOTE — Assessment & Plan Note (Signed)
I will check lipids today. Continue atorvastatin 40 mg daily.
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
## 2024-07-09 NOTE — Assessment & Plan Note (Addendum)
 I will recheck her TSH today. Continue levothyroxine 100 mcg daily.

## 2024-07-09 NOTE — Assessment & Plan Note (Signed)
 Stable. Continue judicious use of naproxen  500 mg daily. I will check renal function today.

## 2024-07-09 NOTE — Assessment & Plan Note (Signed)
 Stable. Continue anastrozole  1 mg daily. Her mammogram is scheduled.

## 2024-07-16 LAB — GENECONNECT MOLECULAR SCREEN: Genetic Analysis Overall Interpretation: NEGATIVE

## 2024-07-16 MED ORDER — ATORVASTATIN CALCIUM 40 MG PO TABS: 60.0000 mg | ORAL_TABLET | Freq: Every day | ORAL | 3 refills | Status: AC

## 2024-08-06 ENCOUNTER — Other Ambulatory Visit: Payer: Self-pay | Admitting: Family Medicine

## 2024-08-06 DIAGNOSIS — F325 Major depressive disorder, single episode, in full remission: Secondary | ICD-10-CM

## 2024-08-09 ENCOUNTER — Inpatient Hospital Stay: Admission: RE | Admit: 2024-08-09 | Discharge: 2024-08-09 | Attending: Adult Health | Admitting: Adult Health

## 2024-08-09 ENCOUNTER — Other Ambulatory Visit: Payer: PPO

## 2024-08-09 DIAGNOSIS — Z170 Estrogen receptor positive status [ER+]: Secondary | ICD-10-CM

## 2024-08-26 ENCOUNTER — Other Ambulatory Visit (HOSPITAL_BASED_OUTPATIENT_CLINIC_OR_DEPARTMENT_OTHER)

## 2024-09-09 ENCOUNTER — Other Ambulatory Visit (HOSPITAL_BASED_OUTPATIENT_CLINIC_OR_DEPARTMENT_OTHER)

## 2024-09-17 ENCOUNTER — Ambulatory Visit: Admitting: Family Medicine

## 2024-10-04 ENCOUNTER — Ambulatory Visit: Admitting: Family Medicine

## 2024-12-03 ENCOUNTER — Ambulatory Visit: Admitting: Hematology and Oncology

## 2025-06-17 ENCOUNTER — Ambulatory Visit
